# Patient Record
Sex: Female | Born: 1940 | Race: White | Hispanic: No | Smoking: Former smoker
Health system: Southern US, Community
[De-identification: ages and names within clinical notes are randomized; demographics above are authoritative.]

## PROBLEM LIST (undated history)

## (undated) DIAGNOSIS — G8929 Other chronic pain: Secondary | ICD-10-CM

## (undated) DIAGNOSIS — I272 Pulmonary hypertension, unspecified: Secondary | ICD-10-CM

## (undated) DIAGNOSIS — Z87440 Personal history of urinary (tract) infections: Secondary | ICD-10-CM

## (undated) DIAGNOSIS — Z8739 Personal history of other diseases of the musculoskeletal system and connective tissue: Secondary | ICD-10-CM

## (undated) DIAGNOSIS — R011 Cardiac murmur, unspecified: Secondary | ICD-10-CM

## (undated) DIAGNOSIS — Z87442 Personal history of urinary calculi: Secondary | ICD-10-CM

## (undated) DIAGNOSIS — G2581 Restless legs syndrome: Secondary | ICD-10-CM

## (undated) DIAGNOSIS — R112 Nausea with vomiting, unspecified: Secondary | ICD-10-CM

## (undated) DIAGNOSIS — D649 Anemia, unspecified: Secondary | ICD-10-CM

## (undated) DIAGNOSIS — H269 Unspecified cataract: Secondary | ICD-10-CM

## (undated) DIAGNOSIS — E785 Hyperlipidemia, unspecified: Secondary | ICD-10-CM

## (undated) DIAGNOSIS — K5790 Diverticulosis of intestine, part unspecified, without perforation or abscess without bleeding: Secondary | ICD-10-CM

## (undated) DIAGNOSIS — G4730 Sleep apnea, unspecified: Secondary | ICD-10-CM

## (undated) DIAGNOSIS — M549 Dorsalgia, unspecified: Secondary | ICD-10-CM

## (undated) DIAGNOSIS — Z9889 Other specified postprocedural states: Secondary | ICD-10-CM

## (undated) DIAGNOSIS — C801 Malignant (primary) neoplasm, unspecified: Secondary | ICD-10-CM

## (undated) DIAGNOSIS — I10 Essential (primary) hypertension: Secondary | ICD-10-CM

## (undated) DIAGNOSIS — E119 Type 2 diabetes mellitus without complications: Secondary | ICD-10-CM

## (undated) DIAGNOSIS — Z9289 Personal history of other medical treatment: Secondary | ICD-10-CM

## (undated) DIAGNOSIS — G629 Polyneuropathy, unspecified: Secondary | ICD-10-CM

## (undated) DIAGNOSIS — R42 Dizziness and giddiness: Secondary | ICD-10-CM

## (undated) DIAGNOSIS — I38 Endocarditis, valve unspecified: Secondary | ICD-10-CM

## (undated) DIAGNOSIS — K219 Gastro-esophageal reflux disease without esophagitis: Secondary | ICD-10-CM

## (undated) DIAGNOSIS — R609 Edema, unspecified: Secondary | ICD-10-CM

## (undated) DIAGNOSIS — R600 Localized edema: Secondary | ICD-10-CM

## (undated) DIAGNOSIS — E039 Hypothyroidism, unspecified: Secondary | ICD-10-CM

## (undated) HISTORY — PX: COLONOSCOPY: SHX174

## (undated) HISTORY — PX: APPENDECTOMY: SHX54

---

## 1979-09-29 HISTORY — PX: CHOLECYSTECTOMY: SHX55

## 1988-09-28 HISTORY — PX: DILATION AND CURETTAGE OF UTERUS: SHX78

## 1996-09-28 HISTORY — PX: CARPAL TUNNEL RELEASE: SHX101

## 1997-11-15 ENCOUNTER — Other Ambulatory Visit: Admission: RE | Admit: 1997-11-15 | Discharge: 1997-11-15 | Payer: Self-pay | Admitting: *Deleted

## 1998-05-09 ENCOUNTER — Other Ambulatory Visit: Admission: RE | Admit: 1998-05-09 | Discharge: 1998-05-09 | Payer: Self-pay | Admitting: *Deleted

## 1998-09-28 HISTORY — PX: TRIGEMINAL NERVE DECOMPRESSION: SHX2579

## 1999-06-12 ENCOUNTER — Other Ambulatory Visit: Admission: RE | Admit: 1999-06-12 | Discharge: 1999-06-12 | Payer: Self-pay | Admitting: *Deleted

## 2000-08-11 ENCOUNTER — Other Ambulatory Visit: Admission: RE | Admit: 2000-08-11 | Discharge: 2000-08-11 | Payer: Self-pay | Admitting: *Deleted

## 2001-09-19 ENCOUNTER — Other Ambulatory Visit: Admission: RE | Admit: 2001-09-19 | Discharge: 2001-09-19 | Payer: Self-pay | Admitting: *Deleted

## 2002-09-18 ENCOUNTER — Other Ambulatory Visit: Admission: RE | Admit: 2002-09-18 | Discharge: 2002-09-18 | Payer: Self-pay | Admitting: *Deleted

## 2011-09-29 HISTORY — PX: BACK SURGERY: SHX140

## 2012-01-20 ENCOUNTER — Other Ambulatory Visit: Payer: Self-pay | Admitting: Neurosurgery

## 2012-01-20 DIAGNOSIS — M545 Low back pain: Secondary | ICD-10-CM

## 2012-04-20 ENCOUNTER — Other Ambulatory Visit: Payer: Self-pay | Admitting: Neurosurgery

## 2012-05-09 ENCOUNTER — Encounter (HOSPITAL_COMMUNITY): Payer: Self-pay | Admitting: Pharmacy Technician

## 2012-05-20 ENCOUNTER — Encounter (HOSPITAL_COMMUNITY)
Admission: RE | Admit: 2012-05-20 | Discharge: 2012-05-20 | Disposition: A | Discharge status: Home / Self Care | Payer: Medicare Other | Source: Ambulatory Visit | Attending: Neurosurgery | Admitting: Neurosurgery

## 2012-05-20 ENCOUNTER — Encounter (HOSPITAL_COMMUNITY): Payer: Self-pay

## 2012-05-20 HISTORY — DX: Anemia, unspecified: D64.9

## 2012-05-20 HISTORY — DX: Gastro-esophageal reflux disease without esophagitis: K21.9

## 2012-05-20 HISTORY — DX: Malignant (primary) neoplasm, unspecified: C80.1

## 2012-05-20 HISTORY — DX: Cardiac murmur, unspecified: R01.1

## 2012-05-20 HISTORY — DX: Other specified postprocedural states: R11.2

## 2012-05-20 HISTORY — DX: Essential (primary) hypertension: I10

## 2012-05-20 HISTORY — DX: Sleep apnea, unspecified: G47.30

## 2012-05-20 HISTORY — DX: Other specified postprocedural states: Z98.890

## 2012-05-20 LAB — BASIC METABOLIC PANEL
GFR calc Af Amer: 43 mL/min — ABNORMAL LOW (ref >90)
GFR calc non Af Amer: 37 mL/min — ABNORMAL LOW (ref >90)
Potassium: 4.9 mEq/L (ref 3.5–5.1)
Sodium: 137 mEq/L (ref 135–145)

## 2012-05-20 LAB — CBC
Hemoglobin: 11.6 g/dL — ABNORMAL LOW (ref 12.0–15.0)
RBC: 4.37 MIL/uL (ref 3.87–5.11)

## 2012-05-20 LAB — SURGICAL PCR SCREEN: MRSA, PCR: NEGATIVE

## 2012-05-20 NOTE — Pre-Procedure Instructions (Addendum)
20 SHONA PARDO  05/20/2012   Your procedure is scheduled on:  05/25/12  Report to [facility] at 630 AM.  Call this number if you have problems the morning of surgery: [phone]   Remember:   Do not eat food or drink:After Midnight.    Take these medicines the morning of surgery with A SIP OF WATER: atenolol ,synthroid, ultram        STOP aspirin, omega 3 fish oil , multi vit , celebrex now   Do not wear jewelry, make-up or nail polish.  Do not wear lotions, powders, or perfumes. You may wear deodorant.  Do not shave 48 hours prior to surgery. Men may shave face and neck.  Do not bring valuables to the hospital.  Contacts, dentures or bridgework may not be worn into surgery.  Leave suitcase in the car. After surgery it may be brought to your room.  For patients admitted to the hospital, checkout time is 11:00 AM the day of discharge.   Patients discharged the day of surgery will not be allowed to drive home.  Name and phone number of your driver: Onalee Hua [phone] son  Special Instructions: CHG Shower Use Special Wash: 1/2 bottle night before surgery and 1/2 bottle morning of surgery.   Please read over the following fact sheets that you were given: Pain Booklet, Coughing and Deep Breathing, Blood Transfusion Information, MRSA Information and Surgical Site Infection Prevention

## 2012-05-20 NOTE — Progress Notes (Signed)
req'd echo, notes, ekg from dr Dulce Sellar at [facility] cardiology in [place], req'd sleep study from [facility] [phone]

## 2012-05-23 NOTE — Progress Notes (Signed)
Sleep study received and placed in chart.

## 2012-05-24 MED ORDER — CEFAZOLIN SODIUM-DEXTROSE 2-3 GM-% IV SOLR
2.0000 g | INTRAVENOUS | Status: AC
  Administered 2012-05-25 (×2): 2 g via INTRAVENOUS
  Filled 2012-05-24: qty 50

## 2012-05-24 NOTE — Progress Notes (Signed)
I spoke with Kennith Center at [facility] office in [place]  She will fax ov echo and ekg ... To [phone] now

## 2012-05-25 ENCOUNTER — Ambulatory Visit (HOSPITAL_COMMUNITY): Payer: Medicare Other

## 2012-05-25 ENCOUNTER — Encounter (HOSPITAL_COMMUNITY): Payer: Self-pay | Admitting: *Deleted

## 2012-05-25 ENCOUNTER — Encounter (HOSPITAL_COMMUNITY)
Admission: RE | Disposition: A | Discharge status: Skilled Nursing Facility | Payer: Self-pay | Source: Ambulatory Visit | Attending: Neurosurgery

## 2012-05-25 ENCOUNTER — Encounter (HOSPITAL_COMMUNITY): Payer: Self-pay | Admitting: Anesthesiology

## 2012-05-25 ENCOUNTER — Inpatient Hospital Stay (HOSPITAL_COMMUNITY)
Admission: RE | Admit: 2012-05-25 | Discharge: 2012-06-02 | DRG: 457 | Disposition: A | Discharge status: Skilled Nursing Facility | Payer: Medicare Other | Source: Ambulatory Visit | Attending: Neurosurgery | Admitting: Neurosurgery

## 2012-05-25 ENCOUNTER — Ambulatory Visit (HOSPITAL_COMMUNITY): Payer: Medicare Other | Admitting: Anesthesiology

## 2012-05-25 DIAGNOSIS — Z01818 Encounter for other preprocedural examination: Secondary | ICD-10-CM

## 2012-05-25 DIAGNOSIS — Z6841 Body Mass Index (BMI) 40.0 and over, adult: Secondary | ICD-10-CM

## 2012-05-25 DIAGNOSIS — Z794 Long term (current) use of insulin: Secondary | ICD-10-CM

## 2012-05-25 DIAGNOSIS — Z981 Arthrodesis status: Secondary | ICD-10-CM

## 2012-05-25 DIAGNOSIS — N183 Chronic kidney disease, stage 3 unspecified: Secondary | ICD-10-CM | POA: Diagnosis present

## 2012-05-25 DIAGNOSIS — E669 Obesity, unspecified: Secondary | ICD-10-CM | POA: Diagnosis present

## 2012-05-25 DIAGNOSIS — M51379 Other intervertebral disc degeneration, lumbosacral region without mention of lumbar back pain or lower extremity pain: Secondary | ICD-10-CM | POA: Diagnosis present

## 2012-05-25 DIAGNOSIS — E162 Hypoglycemia, unspecified: Secondary | ICD-10-CM | POA: Diagnosis not present

## 2012-05-25 DIAGNOSIS — Z7982 Long term (current) use of aspirin: Secondary | ICD-10-CM

## 2012-05-25 DIAGNOSIS — K219 Gastro-esophageal reflux disease without esophagitis: Secondary | ICD-10-CM | POA: Diagnosis present

## 2012-05-25 DIAGNOSIS — M4120 Other idiopathic scoliosis, site unspecified: Principal | ICD-10-CM | POA: Diagnosis present

## 2012-05-25 DIAGNOSIS — Z01812 Encounter for preprocedural laboratory examination: Secondary | ICD-10-CM

## 2012-05-25 DIAGNOSIS — E871 Hypo-osmolality and hyponatremia: Secondary | ICD-10-CM | POA: Diagnosis not present

## 2012-05-25 DIAGNOSIS — Z79899 Other long term (current) drug therapy: Secondary | ICD-10-CM

## 2012-05-25 DIAGNOSIS — I959 Hypotension, unspecified: Secondary | ICD-10-CM | POA: Diagnosis not present

## 2012-05-25 DIAGNOSIS — N179 Acute kidney failure, unspecified: Secondary | ICD-10-CM | POA: Diagnosis not present

## 2012-05-25 DIAGNOSIS — Z87891 Personal history of nicotine dependence: Secondary | ICD-10-CM

## 2012-05-25 DIAGNOSIS — D649 Anemia, unspecified: Secondary | ICD-10-CM | POA: Diagnosis not present

## 2012-05-25 DIAGNOSIS — Z85820 Personal history of malignant melanoma of skin: Secondary | ICD-10-CM

## 2012-05-25 DIAGNOSIS — G4730 Sleep apnea, unspecified: Secondary | ICD-10-CM | POA: Diagnosis present

## 2012-05-25 DIAGNOSIS — M47817 Spondylosis without myelopathy or radiculopathy, lumbosacral region: Secondary | ICD-10-CM | POA: Diagnosis present

## 2012-05-25 DIAGNOSIS — I129 Hypertensive chronic kidney disease with stage 1 through stage 4 chronic kidney disease, or unspecified chronic kidney disease: Secondary | ICD-10-CM | POA: Diagnosis present

## 2012-05-25 DIAGNOSIS — M5137 Other intervertebral disc degeneration, lumbosacral region: Secondary | ICD-10-CM | POA: Diagnosis present

## 2012-05-25 LAB — GLUCOSE, CAPILLARY

## 2012-05-25 SURGERY — POSTERIOR LUMBAR FUSION 4 LEVEL
Anesthesia: General | Site: Back | Laterality: Bilateral | Wound class: Clean

## 2012-05-25 MED ORDER — VECURONIUM BROMIDE 10 MG IV SOLR
INTRAVENOUS | Status: DC | PRN
  Administered 2012-05-25: 7 mg via INTRAVENOUS
  Administered 2012-05-25: 1 mg via INTRAVENOUS
  Administered 2012-05-25 (×4): 2 mg via INTRAVENOUS
  Administered 2012-05-25: 1 mg via INTRAVENOUS

## 2012-05-25 MED ORDER — LACTATED RINGERS IV SOLN
INTRAVENOUS | Status: DC | PRN
  Administered 2012-05-25 (×3): via INTRAVENOUS

## 2012-05-25 MED ORDER — ACETAMINOPHEN 325 MG PO TABS: 650.0000 mg | ORAL_TABLET | ORAL | Status: DC | PRN

## 2012-05-25 MED ORDER — HYDROMORPHONE HCL PF 1 MG/ML IJ SOLN
0.5000 mg | INTRAMUSCULAR | Status: DC | PRN
  Administered 2012-05-25 (×3): 0.5 mg via INTRAVENOUS
  Administered 2012-05-26: 1 mg via INTRAVENOUS
  Administered 2012-05-26 (×2): 0.5 mg via INTRAVENOUS
  Administered 2012-05-27 (×2): 1 mg via INTRAVENOUS
  Administered 2012-05-27 – 2012-05-28 (×3): 0.5 mg via INTRAVENOUS
  Administered 2012-05-28 (×3): 1 mg via INTRAVENOUS
  Administered 2012-05-28 (×2): 0.5 mg via INTRAVENOUS
  Administered 2012-05-29 – 2012-06-02 (×8): 1 mg via INTRAVENOUS
  Filled 2012-05-25 (×25): qty 1

## 2012-05-25 MED ORDER — PIOGLITAZONE HCL 45 MG PO TABS
45.0000 mg | ORAL_TABLET | Freq: Every day | ORAL | Status: DC
  Administered 2012-05-25 – 2012-06-02 (×4): 45 mg via ORAL
  Filled 2012-05-25 (×9): qty 1

## 2012-05-25 MED ORDER — OXYCODONE-ACETAMINOPHEN 5-325 MG PO TABS
1.0000 | ORAL_TABLET | ORAL | Status: DC | PRN
  Administered 2012-05-26: 2 via ORAL
  Administered 2012-05-27 (×2): 1 via ORAL
  Administered 2012-05-29 – 2012-06-02 (×17): 2 via ORAL
  Filled 2012-05-25 (×3): qty 2
  Filled 2012-05-25: qty 1
  Filled 2012-05-25 (×3): qty 2
  Filled 2012-05-25: qty 1
  Filled 2012-05-25 (×13): qty 2

## 2012-05-25 MED ORDER — BACITRACIN 50000 UNITS IM SOLR
INTRAMUSCULAR | Status: AC
  Filled 2012-05-25: qty 1

## 2012-05-25 MED ORDER — GLIMEPIRIDE 4 MG PO TABS
4.0000 mg | ORAL_TABLET | Freq: Two times a day (BID) | ORAL | Status: DC
  Filled 2012-05-25: qty 1

## 2012-05-25 MED ORDER — CEFAZOLIN SODIUM 1-5 GM-% IV SOLN
1.0000 g | Freq: Three times a day (TID) | INTRAVENOUS | Status: AC
  Administered 2012-05-25 – 2012-05-30 (×15): 1 g via INTRAVENOUS
  Filled 2012-05-25 (×21): qty 50

## 2012-05-25 MED ORDER — TRAMADOL HCL 50 MG PO TABS
50.0000 mg | ORAL_TABLET | Freq: Four times a day (QID) | ORAL | Status: DC
  Administered 2012-05-25 – 2012-05-27 (×7): 50 mg via ORAL
  Filled 2012-05-25 (×11): qty 1

## 2012-05-25 MED ORDER — ONDANSETRON HCL 4 MG/2ML IJ SOLN
INTRAMUSCULAR | Status: DC | PRN
  Administered 2012-05-25: 4 mg via INTRAVENOUS

## 2012-05-25 MED ORDER — METFORMIN HCL 500 MG PO TABS: 1000.0000 mg | ORAL_TABLET | Freq: Two times a day (BID) | ORAL | Status: DC

## 2012-05-25 MED ORDER — TRIAMTERENE-HCTZ 75-50 MG PO TABS
1.0000 | ORAL_TABLET | Freq: Every day | ORAL | Status: DC
  Administered 2012-05-25 – 2012-05-26 (×2): 1 via ORAL
  Filled 2012-05-25 (×2): qty 1

## 2012-05-25 MED ORDER — THROMBIN 20000 UNITS EX SOLR
CUTANEOUS | Status: DC | PRN
  Administered 2012-05-25 (×2): via TOPICAL

## 2012-05-25 MED ORDER — METFORMIN HCL 500 MG PO TABS
1000.0000 mg | ORAL_TABLET | Freq: Two times a day (BID) | ORAL | Status: DC
  Administered 2012-05-25 – 2012-05-26 (×2): 1000 mg via ORAL
  Filled 2012-05-25 (×5): qty 2

## 2012-05-25 MED ORDER — ONDANSETRON HCL 4 MG/2ML IJ SOLN: 4.0000 mg | Freq: Once | INTRAMUSCULAR | Status: DC | PRN

## 2012-05-25 MED ORDER — SODIUM CHLORIDE 0.9 % IV SOLN: 250.0000 mL | INTRAVENOUS | Status: DC

## 2012-05-25 MED ORDER — DOCUSATE SODIUM 100 MG PO CAPS
100.0000 mg | ORAL_CAPSULE | Freq: Two times a day (BID) | ORAL | Status: DC
  Administered 2012-05-25 – 2012-06-02 (×16): 100 mg via ORAL
  Filled 2012-05-25 (×17): qty 1

## 2012-05-25 MED ORDER — ALLOPURINOL 300 MG PO TABS
300.0000 mg | ORAL_TABLET | Freq: Every day | ORAL | Status: DC
  Administered 2012-05-25 – 2012-06-02 (×9): 300 mg via ORAL
  Filled 2012-05-25 (×9): qty 1

## 2012-05-25 MED ORDER — CEFAZOLIN SODIUM-DEXTROSE 2-3 GM-% IV SOLR
INTRAVENOUS | Status: AC
  Filled 2012-05-25: qty 50

## 2012-05-25 MED ORDER — ROPINIROLE HCL 1 MG PO TABS
1.0000 mg | ORAL_TABLET | Freq: Every day | ORAL | Status: DC
  Administered 2012-05-25 – 2012-06-01 (×8): 1 mg via ORAL
  Filled 2012-05-25 (×10): qty 1

## 2012-05-25 MED ORDER — SODIUM CHLORIDE 0.9 % IJ SOLN: 3.0000 mL | INTRAMUSCULAR | Status: DC | PRN

## 2012-05-25 MED ORDER — HYDROMORPHONE HCL PF 1 MG/ML IJ SOLN
INTRAMUSCULAR | Status: AC
  Filled 2012-05-25: qty 1

## 2012-05-25 MED ORDER — ONDANSETRON HCL 4 MG/2ML IJ SOLN
4.0000 mg | INTRAMUSCULAR | Status: DC | PRN
  Filled 2012-05-25: qty 2

## 2012-05-25 MED ORDER — ATENOLOL 50 MG PO TABS
50.0000 mg | ORAL_TABLET | Freq: Every day | ORAL | Status: DC
  Filled 2012-05-25 (×2): qty 1

## 2012-05-25 MED ORDER — INSULIN GLARGINE 100 UNIT/ML ~~LOC~~ SOLN
54.0000 [IU] | Freq: Every day | SUBCUTANEOUS | Status: DC
  Administered 2012-05-25 – 2012-05-27 (×3): 54 [IU] via SUBCUTANEOUS

## 2012-05-25 MED ORDER — LIDOCAINE-EPINEPHRINE 1 %-1:100000 IJ SOLN
INTRAMUSCULAR | Status: DC | PRN
  Administered 2012-05-25: 10 mL

## 2012-05-25 MED ORDER — PHENOL 1.4 % MT LIQD: 1.0000 | OROMUCOSAL | Status: DC | PRN

## 2012-05-25 MED ORDER — ASPIRIN EC 81 MG PO TBEC
81.0000 mg | DELAYED_RELEASE_TABLET | Freq: Every day | ORAL | Status: DC
  Administered 2012-05-25 – 2012-06-02 (×8): 81 mg via ORAL
  Filled 2012-05-25 (×9): qty 1

## 2012-05-25 MED ORDER — LEVOTHYROXINE SODIUM 125 MCG PO TABS
125.0000 ug | ORAL_TABLET | Freq: Every day | ORAL | Status: DC
  Administered 2012-05-26 – 2012-06-02 (×8): 125 ug via ORAL
  Filled 2012-05-25 (×12): qty 1

## 2012-05-25 MED ORDER — MIDAZOLAM HCL 5 MG/5ML IJ SOLN
INTRAMUSCULAR | Status: DC | PRN
  Administered 2012-05-25: 1 mg via INTRAVENOUS

## 2012-05-25 MED ORDER — MENTHOL 3 MG MT LOZG: 1.0000 | LOZENGE | OROMUCOSAL | Status: DC | PRN

## 2012-05-25 MED ORDER — SODIUM CHLORIDE 0.9 % IR SOLN
Status: DC | PRN
  Administered 2012-05-25: 09:00:00

## 2012-05-25 MED ORDER — GLYCOPYRROLATE 0.2 MG/ML IJ SOLN
INTRAMUSCULAR | Status: DC | PRN
  Administered 2012-05-25: 0.1 mg via INTRAVENOUS
  Administered 2012-05-25: .9 mg via INTRAVENOUS

## 2012-05-25 MED ORDER — ACETAMINOPHEN 650 MG RE SUPP: 650.0000 mg | RECTAL | Status: DC | PRN

## 2012-05-25 MED ORDER — PROPOFOL 10 MG/ML IV EMUL
INTRAVENOUS | Status: DC | PRN
  Administered 2012-05-25: 150 mg via INTRAVENOUS

## 2012-05-25 MED ORDER — VITAMIN B-12 1000 MCG PO TABS
1000.0000 ug | ORAL_TABLET | Freq: Every day | ORAL | Status: DC
  Administered 2012-05-25 – 2012-06-02 (×9): 1000 ug via ORAL
  Filled 2012-05-25 (×9): qty 1

## 2012-05-25 MED ORDER — HYDROMORPHONE HCL PF 1 MG/ML IJ SOLN
0.2500 mg | INTRAMUSCULAR | Status: DC | PRN
  Administered 2012-05-25 (×3): 0.5 mg via INTRAVENOUS

## 2012-05-25 MED ORDER — SODIUM CHLORIDE 0.9 % IV SOLN
INTRAVENOUS | Status: AC
  Filled 2012-05-25: qty 500

## 2012-05-25 MED ORDER — CYCLOBENZAPRINE HCL 10 MG PO TABS
10.0000 mg | ORAL_TABLET | Freq: Three times a day (TID) | ORAL | Status: DC | PRN
  Administered 2012-06-01 (×2): 10 mg via ORAL
  Filled 2012-05-25 (×3): qty 1

## 2012-05-25 MED ORDER — ACETAMINOPHEN 10 MG/ML IV SOLN
INTRAVENOUS | Status: DC | PRN
  Administered 2012-05-25: 1000 mg via INTRAVENOUS

## 2012-05-25 MED ORDER — BUPIVACAINE HCL (PF) 0.25 % IJ SOLN
INTRAMUSCULAR | Status: DC | PRN
  Administered 2012-05-25: 20 mL

## 2012-05-25 MED ORDER — POTASSIUM CHLORIDE CRYS ER 20 MEQ PO TBCR
20.0000 meq | EXTENDED_RELEASE_TABLET | Freq: Every day | ORAL | Status: DC
  Administered 2012-05-26: 20 meq via ORAL
  Filled 2012-05-25: qty 1

## 2012-05-25 MED ORDER — PANTOPRAZOLE SODIUM 40 MG IV SOLR
40.0000 mg | Freq: Every day | INTRAVENOUS | Status: DC
  Administered 2012-05-25: 40 mg via INTRAVENOUS
  Filled 2012-05-25 (×2): qty 40

## 2012-05-25 MED ORDER — 0.9 % SODIUM CHLORIDE (POUR BTL) OPTIME
TOPICAL | Status: DC | PRN
  Administered 2012-05-25: 1000 mL

## 2012-05-25 MED ORDER — LIDOCAINE HCL (CARDIAC) 20 MG/ML IV SOLN
INTRAVENOUS | Status: DC | PRN
  Administered 2012-05-25: 100 mg via INTRAVENOUS

## 2012-05-25 MED ORDER — ACETAMINOPHEN 10 MG/ML IV SOLN
INTRAVENOUS | Status: AC
  Filled 2012-05-25: qty 100

## 2012-05-25 MED ORDER — POTASSIUM CHLORIDE IN NACL 20-0.45 MEQ/L-% IV SOLN
INTRAVENOUS | Status: DC
  Administered 2012-05-25 – 2012-05-26 (×2): via INTRAVENOUS
  Filled 2012-05-25 (×3): qty 1000

## 2012-05-25 MED ORDER — DROPERIDOL 2.5 MG/ML IJ SOLN
INTRAMUSCULAR | Status: DC | PRN
  Administered 2012-05-25: 0.625 mg via INTRAVENOUS

## 2012-05-25 MED ORDER — SODIUM CHLORIDE 0.9 % IJ SOLN
3.0000 mL | Freq: Two times a day (BID) | INTRAMUSCULAR | Status: DC
  Administered 2012-05-25 – 2012-06-01 (×12): 3 mL via INTRAVENOUS

## 2012-05-25 MED ORDER — CELECOXIB 200 MG PO CAPS
200.0000 mg | ORAL_CAPSULE | Freq: Two times a day (BID) | ORAL | Status: DC
  Administered 2012-05-25 – 2012-05-26 (×2): 200 mg via ORAL
  Filled 2012-05-25 (×3): qty 1

## 2012-05-25 MED ORDER — LOSARTAN POTASSIUM 50 MG PO TABS
100.0000 mg | ORAL_TABLET | Freq: Every day | ORAL | Status: DC
  Administered 2012-05-25 – 2012-05-26 (×2): 100 mg via ORAL
  Filled 2012-05-25 (×2): qty 2

## 2012-05-25 MED ORDER — PANTOPRAZOLE SODIUM 40 MG PO TBEC
40.0000 mg | DELAYED_RELEASE_TABLET | Freq: Every day | ORAL | Status: DC
  Administered 2012-05-25 – 2012-06-02 (×9): 40 mg via ORAL
  Filled 2012-05-25 (×9): qty 1

## 2012-05-25 MED ORDER — ATORVASTATIN CALCIUM 40 MG PO TABS
40.0000 mg | ORAL_TABLET | Freq: Every day | ORAL | Status: DC
  Administered 2012-05-25 – 2012-06-01 (×8): 40 mg via ORAL
  Filled 2012-05-25 (×9): qty 1

## 2012-05-25 MED ORDER — FENTANYL CITRATE 0.05 MG/ML IJ SOLN
INTRAMUSCULAR | Status: DC | PRN
  Administered 2012-05-25: 75 ug via INTRAVENOUS
  Administered 2012-05-25 (×4): 50 ug via INTRAVENOUS
  Administered 2012-05-25: 75 ug via INTRAVENOUS
  Administered 2012-05-25: 50 ug via INTRAVENOUS

## 2012-05-25 MED ORDER — NEOSTIGMINE METHYLSULFATE 1 MG/ML IJ SOLN
INTRAMUSCULAR | Status: DC | PRN
  Administered 2012-05-25: 5 mg via INTRAVENOUS

## 2012-05-25 MED ORDER — GLIMEPIRIDE 4 MG PO TABS
4.0000 mg | ORAL_TABLET | Freq: Two times a day (BID) | ORAL | Status: DC
  Administered 2012-05-25 – 2012-05-28 (×5): 4 mg via ORAL
  Filled 2012-05-25 (×10): qty 1

## 2012-05-25 MED ORDER — ADULT MULTIVITAMIN W/MINERALS CH
1.0000 | ORAL_TABLET | Freq: Every day | ORAL | Status: DC
  Administered 2012-05-25 – 2012-06-02 (×9): 1 via ORAL
  Filled 2012-05-25 (×9): qty 1

## 2012-05-25 SURGICAL SUPPLY — 79 items
5.5 mm curved Ti Rod 125 mm ×2 IMPLANT
5.5 mm straight Ti Rod 125 mm ×2 IMPLANT
BAG DECANTER FOR FLEXI CONT (MISCELLANEOUS) ×2 IMPLANT
BENZOIN TINCTURE PRP APPL 2/3 (GAUZE/BANDAGES/DRESSINGS) ×2 IMPLANT
BLADE SURG 11 STRL SS (BLADE) ×2 IMPLANT
BLADE SURG ROTATE 9660 (MISCELLANEOUS) IMPLANT
BRUSH SCRUB EZ PLAIN DRY (MISCELLANEOUS) ×2 IMPLANT
BUR MATCHSTICK NEURO 3.0 LAGG (BURR) ×2 IMPLANT
BUR PRECISION FLUTE 6.0 (BURR) ×2 IMPLANT
CALIBER 7-11 10X22 (Neuro Prosthesis/Implant) ×8 IMPLANT
CANISTER SUCTION 2500CC (MISCELLANEOUS) ×2 IMPLANT
CAP LOCKING REVERE (Cap) ×20 IMPLANT
CLOTH BEACON ORANGE TIMEOUT ST (SAFETY) ×2 IMPLANT
CONNECTOR VAR CROSS 5.5X38-50 (Connector) ×2 IMPLANT
CONT SPEC 4OZ CLIKSEAL STRL BL (MISCELLANEOUS) ×4 IMPLANT
COVER BACK TABLE 24X17X13 BIG (DRAPES) ×2 IMPLANT
COVER TABLE BACK 60X90 (DRAPES) IMPLANT
DECANTER SPIKE VIAL GLASS SM (MISCELLANEOUS) ×2 IMPLANT
DERMABOND ADVANCED (GAUZE/BANDAGES/DRESSINGS) ×1
DERMABOND ADVANCED .7 DNX12 (GAUZE/BANDAGES/DRESSINGS) ×1 IMPLANT
DRAPE C-ARM 42X72 X-RAY (DRAPES) ×4 IMPLANT
DRAPE C-ARMOR (DRAPES) ×2 IMPLANT
DRAPE LAPAROTOMY 100X72X124 (DRAPES) ×2 IMPLANT
DRAPE POUCH INSTRU U-SHP 10X18 (DRAPES) ×2 IMPLANT
DRAPE PROXIMA HALF (DRAPES) IMPLANT
DRAPE SURG 17X23 STRL (DRAPES) ×2 IMPLANT
DRSG OPSITE 4X5.5 SM (GAUZE/BANDAGES/DRESSINGS) ×6 IMPLANT
ELECT BLADE 4.0 EZ CLEAN MEGAD (MISCELLANEOUS) ×2
ELECT REM PT RETURN 9FT ADLT (ELECTROSURGICAL) ×2
ELECTRODE BLDE 4.0 EZ CLN MEGD (MISCELLANEOUS) ×1 IMPLANT
ELECTRODE REM PT RTRN 9FT ADLT (ELECTROSURGICAL) ×1 IMPLANT
EVACUATOR 3/16  PVC DRAIN (DRAIN) ×2
EVACUATOR 3/16 PVC DRAIN (DRAIN) ×2 IMPLANT
GAUZE SPONGE 4X4 16PLY XRAY LF (GAUZE/BANDAGES/DRESSINGS) ×4 IMPLANT
GLOVE BIO SURGEON STRL SZ8 (GLOVE) ×4 IMPLANT
GLOVE BIOGEL PI IND STRL 7.0 (GLOVE) ×4 IMPLANT
GLOVE BIOGEL PI IND STRL 7.5 (GLOVE) ×1 IMPLANT
GLOVE BIOGEL PI INDICATOR 7.0 (GLOVE) ×4
GLOVE BIOGEL PI INDICATOR 7.5 (GLOVE) ×1
GLOVE ECLIPSE 6.5 STRL STRAW (GLOVE) ×2 IMPLANT
GLOVE ECLIPSE 7.5 STRL STRAW (GLOVE) ×2 IMPLANT
GLOVE EXAM NITRILE LRG STRL (GLOVE) IMPLANT
GLOVE EXAM NITRILE MD LF STRL (GLOVE) ×4 IMPLANT
GLOVE EXAM NITRILE XL STR (GLOVE) IMPLANT
GLOVE EXAM NITRILE XS STR PU (GLOVE) IMPLANT
GLOVE INDICATOR 8.5 STRL (GLOVE) ×4 IMPLANT
GLOVE SS BIOGEL STRL SZ 6.5 (GLOVE) ×5 IMPLANT
GLOVE SUPERSENSE BIOGEL SZ 6.5 (GLOVE) ×5
GOWN BRE IMP SLV AUR LG STRL (GOWN DISPOSABLE) ×4 IMPLANT
GOWN BRE IMP SLV AUR XL STRL (GOWN DISPOSABLE) ×6 IMPLANT
GOWN STRL REIN 2XL LVL4 (GOWN DISPOSABLE) IMPLANT
KIT BASIN OR (CUSTOM PROCEDURE TRAY) ×2 IMPLANT
KIT INFUSE SMALL (Orthopedic Implant) ×2 IMPLANT
KIT ROOM TURNOVER OR (KITS) ×2 IMPLANT
MARKER SKIN DUAL TIP RULER LAB (MISCELLANEOUS) ×2 IMPLANT
MILL MEDIUM DISP (BLADE) ×2 IMPLANT
MIX DBX 10CC 35% BONE (Bone Implant) ×2 IMPLANT
NEEDLE HYPO 25X1 1.5 SAFETY (NEEDLE) ×2 IMPLANT
NS IRRIG 1000ML POUR BTL (IV SOLUTION) ×2 IMPLANT
PACK LAMINECTOMY NEURO (CUSTOM PROCEDURE TRAY) ×2 IMPLANT
PAD ARMBOARD 7.5X6 YLW CONV (MISCELLANEOUS) ×6 IMPLANT
PATTIES SURGICAL 1X1 (DISPOSABLE) ×4 IMPLANT
SCREW PEDICLE 6.5MMX35MM (Screw) ×12 IMPLANT
SCREW PEDICLE 6.5X40MM (Screw) ×8 IMPLANT
SPACER CALIBER 10X22MM 8-12MM (Spacer) ×4 IMPLANT
SPACER CALIBER 10X22MM 9-13MM (Spacer) ×4 IMPLANT
SPONGE GAUZE 4X4 12PLY (GAUZE/BANDAGES/DRESSINGS) ×2 IMPLANT
SPONGE LAP 4X18 X RAY DECT (DISPOSABLE) IMPLANT
SPONGE SURGIFOAM ABS GEL 100 (HEMOSTASIS) ×4 IMPLANT
STRIP CLOSURE SKIN 1/2X4 (GAUZE/BANDAGES/DRESSINGS) ×2 IMPLANT
SUT VIC AB 0 CT1 18XCR BRD8 (SUTURE) ×2 IMPLANT
SUT VIC AB 0 CT1 8-18 (SUTURE) ×2
SUT VIC AB 2-0 CT1 18 (SUTURE) ×4 IMPLANT
SUT VICRYL 4-0 PS2 18IN ABS (SUTURE) ×2 IMPLANT
SYR 20ML ECCENTRIC (SYRINGE) ×2 IMPLANT
TOWEL OR 17X24 6PK STRL BLUE (TOWEL DISPOSABLE) ×2 IMPLANT
TOWEL OR 17X26 10 PK STRL BLUE (TOWEL DISPOSABLE) ×2 IMPLANT
TRAY FOLEY CATH 14FRSI W/METER (CATHETERS) ×2 IMPLANT
WATER STERILE IRR 1000ML POUR (IV SOLUTION) ×2 IMPLANT

## 2012-05-25 NOTE — Anesthesia Preprocedure Evaluation (Addendum)
Anesthesia Evaluation  Patient identified by MRN, date of birth, ID band Patient awake    Reviewed: Allergy & Precautions, H&P , NPO status , Patient's Chart, lab work & pertinent test results, reviewed documented beta blocker date and time   History of Anesthesia Complications (+) PONV  Airway Mallampati: II TM Distance: >3 FB Neck ROM: full    Dental  (+) Dental Advidsory Given and Teeth Intact   Pulmonary shortness of breath and with exertion, sleep apnea and Continuous Positive Airway Pressure Ventilation ,  breath sounds clear to auscultation        Cardiovascular hypertension, Pt. on home beta blockers + Valvular Problems/Murmurs AS and MR Rhythm:Regular Rate:Normal     Neuro/Psych    GI/Hepatic GERD-  Medicated and Controlled,  Endo/Other    Renal/GU      Musculoskeletal   Abdominal   Peds  Hematology   Anesthesia Other Findings   Reproductive/Obstetrics                         Anesthesia Physical Anesthesia Plan  ASA: IV  Anesthesia Plan: General   Post-op Pain Management:    Induction: Intravenous  Airway Management Planned: Oral ETT  Additional Equipment:   Intra-op Plan:   Post-operative Plan: Extubation in OR  Informed Consent: I have reviewed the patients History and Physical, chart, labs and discussed the procedure including the risks, benefits and alternatives for the proposed anesthesia with the patient or authorized representative who has indicated his/her understanding and acceptance.   Dental Advisory Given  Plan Discussed with: Anesthesiologist, CRNA and Surgeon  Anesthesia Plan Comments:        Anesthesia Quick Evaluation

## 2012-05-25 NOTE — Preoperative (Signed)
Beta Blockers   Reason not to administer Beta Blockers:Not Applicable 

## 2012-05-25 NOTE — Anesthesia Procedure Notes (Signed)
Procedure Name: Intubation Date/Time: 05/25/2012 8:47 AM Performed by: Carmela Rima Pre-anesthesia Checklist: Emergency Drugs available, Patient identified, Timeout performed, Suction available and Patient being monitored Patient Re-evaluated:Patient Re-evaluated prior to inductionOxygen Delivery Method: Circle system utilized Preoxygenation: Pre-oxygenation with 100% oxygen Intubation Type: IV induction Ventilation: Mask ventilation without difficulty Laryngoscope Size: Mac and 3 Grade View: Grade I Tube type: Oral Tube size: 7.0 mm Number of attempts: 1 Placement Confirmation: ETT inserted through vocal cords under direct vision,  breath sounds checked- equal and bilateral and positive ETCO2 Secured at: 22 cm Tube secured with: Tape Dental Injury: Teeth and Oropharynx as per pre-operative assessment

## 2012-05-25 NOTE — H&P (Signed)
Paige Holland is an 71 y.o. female.   Chief Complaint: back and right greater left leg pain HPI: Patient very pleasant 70 one of them is a long-standing back and bilateral leg pain with neurogenic claudication or very short distances. Has been refractory to all forms of conservative treatment with integumentary physical therapy injections and time she's had developed weakness in the right foot imaging revealed severe lumbar spinal stenosis and spinal listhesis as well as degenerative scoliosis extending from L2 down to S1 due to her failure conservative treatment imaging findings and progression of clinical syndrome she was recommended decompression it was a short procedure from L2 down to S1 and excess will the risks benefits of the operation as well as perioperative course and expectations of outcome and alternatives of surgery Paige Holland stands and agrees to proceed forward.  Past Medical History  Diagnosis Date  . PONV (postoperative nausea and vomiting)   . Hypertension   . Shortness of breath   . Sleep apnea     does not use cpap but has one  . Heart murmur   . GERD (gastroesophageal reflux disease)   . Cancer     melanoma lft arm  . Anemia     hx    Past Surgical History  Procedure Date  . Cholecystectomy 81  . Appendectomy   . Dilation and curettage of uterus 90  . Carpal tunnel release 98    rt  . Trigeminal nerve decompression 2000    gamma knife    History reviewed. No pertinent family history. Social History:  reports that she quit smoking about 46 years ago. Her smoking use included Cigarettes. She has a 1.5 pack-year smoking history. She does not have any smokeless tobacco history on file. She reports that she does not drink alcohol or use illicit drugs.  Allergies:  Allergies  Allergen Reactions  . Codeine Other (See Comments)    Stomach spasms  . Talwin (Pentazocine) Nausea And Vomiting  . Tape Other (See Comments)    "if adhesive left on for any length of time  area turns red, bumps"    Medications Prior to Admission  Medication Sig Dispense Refill  . allopurinol (ZYLOPRIM) 300 MG tablet Take 300 mg by mouth every morning.      Marland Kitchen aspirin EC 81 MG tablet Take 81 mg by mouth daily.      Marland Kitchen atenolol (TENORMIN) 50 MG tablet Take 50 mg by mouth every morning.      Marland Kitchen atorvastatin (LIPITOR) 40 MG tablet Take 40 mg by mouth at bedtime.      . Calcium Carbonate-Vit D-Min (CALCIUM 1200 PO) Take 1 tablet by mouth every morning.      . celecoxib (CELEBREX) 200 MG capsule Take 200 mg by mouth 2 (two) times daily.      . Cholecalciferol (VITAMIN D) 2000 UNITS tablet Take 2,000 Units by mouth every morning.      Marland Kitchen glimepiride (AMARYL) 4 MG tablet Take 4 mg by mouth 2 (two) times daily.      . insulin glargine (LANTUS) 100 UNIT/ML injection Inject 54 Units into the skin at bedtime.      Marland Kitchen levothyroxine (SYNTHROID, LEVOTHROID) 125 MCG tablet Take 125 mcg by mouth daily.      Marland Kitchen losartan (COZAAR) 100 MG tablet Take 100 mg by mouth every morning.      . metFORMIN (GLUCOPHAGE) 1000 MG tablet Take 1,000 mg by mouth 2 (two) times daily with a meal.      .  Multiple Vitamin (MULTIVITAMIN WITH MINERALS) TABS Take 1 tablet by mouth daily.      . Omega-3 Fatty Acids (FISH OIL) 1000 MG CAPS Take 1 capsule by mouth 3 (three) times daily.      Marland Kitchen omeprazole (PRILOSEC) 20 MG capsule Take 20 mg by mouth daily.      . pioglitazone (ACTOS) 45 MG tablet Take 45 mg by mouth every morning.      . potassium chloride SA (K-DUR,KLOR-CON) 20 MEQ tablet Take 20 mEq by mouth every morning.      Marland Kitchen rOPINIRole (REQUIP) 1 MG tablet Take 1 mg by mouth at bedtime.      . traMADol (ULTRAM) 50 MG tablet Take 50 mg by mouth every 6 (six) hours as needed. For pain      . triamterene-hydrochlorothiazide (MAXZIDE) 75-50 MG per tablet Take 1 tablet by mouth every morning.      . vitamin B-12 (CYANOCOBALAMIN) 1000 MCG tablet Take 1,000 mcg by mouth every morning.        Results for orders placed during  the hospital encounter of 05/25/12 (from the past 48 hour(s))  GLUCOSE, CAPILLARY     Status: Abnormal   Collection Time   05/25/12  6:50 AM      Component Value Range Comment   Glucose-Capillary 172 (*) 70 - 99 mg/dL    No results found.  Review of Systems  Constitutional: Negative.   HENT: Negative.   Eyes: Negative.   Respiratory: Negative.   Cardiovascular: Negative.   Gastrointestinal: Negative.   Genitourinary: Negative.   Musculoskeletal: Positive for myalgias, back pain and joint pain.  Skin: Negative.   Neurological: Positive for tingling and sensory change.  Endo/Heme/Allergies: Negative.   Psychiatric/Behavioral: Negative.     Blood pressure 148/81, pulse 47, temperature 98.2 F (36.8 C), temperature source Oral, resp. rate 18, SpO2 96.00%. Physical Exam  Constitutional: She is oriented to person, place, and time. She appears well-developed and well-nourished.  Eyes: Pupils are equal, round, and reactive to light.  Neck: Normal range of motion.  Respiratory: Effort normal and breath sounds normal.  GI: Soft.  Neurological: She is alert and oriented to person, place, and time. GCS eye subscore is 4. GCS verbal subscore is 5. GCS motor subscore is 6.  Reflex Scores:      Patellar reflexes are 0 on the right side and 0 on the left side.      Achilles reflexes are 0 on the right side and 0 on the left side.      Patient has 5 out of 5 strength in her iliopsoas, quads, hamstrings, on the left leg into tibialis gastrocs EHL are 5 out of 5 on the right leg she has a little weakness in dorsiflexion the right foot a 4 minus out of 5 in 4-5 weakness of her right EHL     Assessment/Plan 71 year old female presents for an L2 3, L3-4, L4-5, L5-S1 posterior lumbar interbody fusion  Khoen Genet P 05/25/2012, 8:13 AM

## 2012-05-25 NOTE — Anesthesia Postprocedure Evaluation (Signed)
  Anesthesia Post-op Note  Patient: Paige Holland  Procedure(s) Performed: Procedure(s) (LRB): POSTERIOR LUMBAR FUSION 4 LEVEL (Bilateral)  Patient Location: PACU  Anesthesia Type: General  Level of Consciousness: awake, alert  and oriented  Airway and Oxygen Therapy: Patient Spontanous Breathing and Patient connected to nasal cannula oxygen  Post-op Pain: moderate  Post-op Assessment: Post-op Vital signs reviewed  Post-op Vital Signs: Reviewed  Complications: No apparent anesthesia complications

## 2012-05-25 NOTE — Transfer of Care (Signed)
Immediate Anesthesia Transfer of Care Note  Patient: Paige Holland  Procedure(s) Performed: Procedure(s) (LRB): POSTERIOR LUMBAR FUSION 4 LEVEL (Bilateral)  Patient Location: PACU  Anesthesia Type: General  Level of Consciousness: awake, oriented and sedated  Airway & Oxygen Therapy: Patient Spontanous Breathing and Patient connected to face mask oxygen  Post-op Assessment: Report given to PACU RN, Post -op Vital signs reviewed and stable, Patient moving all extremities X 4 and Patient able to stick tongue midline  Post vital signs: Reviewed and stable  Complications: No apparent anesthesia complications

## 2012-05-25 NOTE — Op Note (Signed)
Preoperative diagnosis: Degenerative disc disease L2-3, L3-4, L4-5, L5-S1, degenerative scoliosis L2-3, L3-4, L4-5, and L5-S1. Lumbar spinal stenosis L2-3, L3-4, L4-5, and L5-S1. Severe foraminal stenosis of the L2, L3, L4, L5, and S1 nerve roots. A neurogenic claudication  Postoperative diagnosis: Same  Procedure: #1 decompressive lumbar laminectomies and excess will be needed with a standard interbody fusion at L2-3, L3-4, L4-5, and L5-S1.  2 posterior lumbar interbody fusion L2-3, L3-4, L4-5, L5-S1. Using the caliper expandable peek cages packed with local autograft mixed with DBX and BMP  #3 pedicle screw fixation L2-S1 using the globus Revere pedicle screw system 5.5  #4 posterior lateral arthrodesis L2-S1 using local autograft mixed DBX and BMP  #5 open reduction spinal deformity L2-S1  Placement of a large Hemovac drain  Surgeon: Jillyn Hidden Leslieann Whisman  Assistant: Coletta Memos  Anesthesia: Gen.  EBL: 600  History of present illness: Patient is a 71 year old female presents with severe back and bilateral leg pain and neurogenic claudication were very short distances. She is refractory to all forms of conservative treatment with anti-inflammatories physical therapy and steroids and time workup with MRI scan with and without contrast and CT scan as well as plain films showed severe degenerative disc disease and degenerative scoliosis lumbar spinal stenosis causing by foraminal stenosis of the L2, L3-L4, L5, L5-S1 nerve root the disc patient takes her treatment progression of clinical syndrome and imaging findings she was recommended decompression stabilization procedure at L2-3, L3-4, L4-5, L5-S1. Risks and benefits of the operation as well as perioperative course and expectations of outcome alternatives of surgery were all spine the patient she understood and agreed to proceed forward.  Operative procedure: Patient brought into the or was induced and general anesthesia and positioned prone the  [place] frame her back was prepped and draped in routine sterile fashion. A midline incision made the cartilage taken the subcutaneous tissue subperiosteal dissection carried lamina of L2, L3, L4, L5, S1 TPS been exposed from L2 down to S1 interoperative X. identify the appropriate level so that a decompression was begun by removing the spinous processes at L2, L3, L4, and L5. Then central laminotomy was begun there was marked ligamentous hypertrophy and hourglass compression of thecal sac the worst level was L4-5 but complete medial facetectomies were performed at L2-3 L3-4 L4-5 and L5-S1 11 aggressive and radical foraminotomies of the L2, L3, L4, L5, and S1 nerve roots bilaterally the aggressive abutting the superior tickling facet at each level also a gain access lateral margins disc space there was marked indentation thecal sac the worst level again was L4-5 in the worse side was the right side the after his complete decompression been performed attention to screw placement using a high-speed drill pilot holes were drilled and admitted for starting at L2 K. with the awl probed O55 Probed again and a 6 x 40 screw inserted at L2 on the right this was followed up in a similar fashion the screws at L3 03/03/1939 and 6 5 x 30 5J and L4, L5, and S1. This was repeated on the left side after all 10 screws were placed fluoroscopy confirmed good placement as well as the utilization of internal and external bony landmarks confirm no mediolateral breech roll used to confirm adequate positioning of the pedicle screws. Tensions and taken to the interbody work the first working on the left side distractors were inserted on the right at each disc space level sequentially the spaces were cleaned out with pituitary rongeurs Epstein curettes rotating cutters are adequate  endplate preparation been achieved a 9 mm cage was inserted L5-S1 then expanded approximately 3 turns up to approximately 11 mm it at L4-5-1 7 mm cage was placed  in a similar fashion as well as simple the cages at 3423. Aida Puffer is in place the left is to the right side and the fascia endplate preparation was carried out rotating cutters Epstein curettes and pituitary rongeurs after adequate endplate preparation been achieved the central interspaces at all 3 levels were packed with BMP local are graft mixed DBX the cages and similar size were all inserted on the right side and opened up and until 3 finger tension was achieved in this significantly reduce the scoliotic deformity. After all the implants and confirmed to be in good position posterior fluoroscopy confirmed all screws and implants it into position. Her wound scope was irrigated meticulous hemostasis was maintained aggressive decortication was care MTPs or lateral gutters all local autograft with active use and BMP was packed posterior laterally then to one 25 mm rods were contoured and inserted all nuts were tightened down the L5 screws compressed against S1 the L4 compressing against L5 the L3 compressed against L4 the L2 compressing is L3. Then a crossing was applied although foramina were reinspected prior to cross-link application to confirm patency Gelfoam was overlaid top of the dura across it was applied and the wounds closed in layers after a large Hemovac drain was placed and the wounds closed with after Vicryl the skin was) 4 subcuticular benzoin Steri-Strips applied patient recovered in stable condition at the M. and counts was correct.

## 2012-05-26 ENCOUNTER — Inpatient Hospital Stay (HOSPITAL_COMMUNITY): Payer: Medicare Other

## 2012-05-26 LAB — BASIC METABOLIC PANEL
CO2: 20 mEq/L (ref 19–32)
Calcium: 8 mg/dL — ABNORMAL LOW (ref 8.4–10.5)
Chloride: 100 mEq/L (ref 96–112)
Creatinine, Ser: 2.03 mg/dL — ABNORMAL HIGH (ref 0.50–1.10)
Creatinine, Ser: 2.27 mg/dL — ABNORMAL HIGH (ref 0.50–1.10)
GFR calc Af Amer: 27 mL/min — ABNORMAL LOW (ref >90)
Glucose, Bld: 234 mg/dL — ABNORMAL HIGH (ref 70–99)
Potassium: 4.7 mEq/L (ref 3.5–5.1)
Sodium: 132 mEq/L — ABNORMAL LOW (ref 135–145)

## 2012-05-26 LAB — CREATININE, URINE, RANDOM: Creatinine, Urine: 194.18 mg/dL

## 2012-05-26 LAB — CBC WITH DIFFERENTIAL/PLATELET
Basophils Absolute: 0 10*3/uL (ref 0.0–0.1)
HCT: 26.4 % — ABNORMAL LOW (ref 36.0–46.0)
Lymphocytes Relative: 19 % (ref 12–46)
Monocytes Absolute: 2.2 10*3/uL — ABNORMAL HIGH (ref 0.1–1.0)
Neutro Abs: 10.2 10*3/uL — ABNORMAL HIGH (ref 1.7–7.7)
Platelets: 215 10*3/uL (ref 150–400)
RDW: 17.1 % — ABNORMAL HIGH (ref 11.5–15.5)
WBC: 15.2 10*3/uL — ABNORMAL HIGH (ref 4.0–10.5)

## 2012-05-26 LAB — URINALYSIS, ROUTINE W REFLEX MICROSCOPIC
Nitrite: NEGATIVE
Specific Gravity, Urine: 1.024 (ref 1.005–1.030)
pH: 5 (ref 5.0–8.0)

## 2012-05-26 LAB — SODIUM, URINE, RANDOM: Sodium, Ur: <10 mEq/L

## 2012-05-26 LAB — URINE MICROSCOPIC-ADD ON

## 2012-05-26 MED ORDER — SODIUM CHLORIDE 0.9 % IV BOLUS (SEPSIS)
250.0000 mL | Freq: Once | INTRAVENOUS | Status: AC
  Administered 2012-05-26: 250 mL via INTRAVENOUS

## 2012-05-26 MED ORDER — SODIUM CHLORIDE 0.9 % IV SOLN
INTRAVENOUS | Status: DC
  Administered 2012-05-26 – 2012-05-27 (×3): via INTRAVENOUS
  Administered 2012-05-27: 500 mL via INTRAVENOUS
  Administered 2012-05-28: 04:00:00 via INTRAVENOUS

## 2012-05-26 MED ORDER — INSULIN ASPART 100 UNIT/ML ~~LOC~~ SOLN: 0.0000 [IU] | Freq: Every day | SUBCUTANEOUS | Status: DC

## 2012-05-26 MED ORDER — INSULIN ASPART 100 UNIT/ML ~~LOC~~ SOLN
0.0000 [IU] | Freq: Three times a day (TID) | SUBCUTANEOUS | Status: DC
  Administered 2012-05-27: 2 [IU] via SUBCUTANEOUS
  Administered 2012-05-28 (×2): 3 [IU] via SUBCUTANEOUS
  Administered 2012-05-29: 2 [IU] via SUBCUTANEOUS
  Administered 2012-05-30 (×2): 5 [IU] via SUBCUTANEOUS
  Administered 2012-05-31: 3 [IU] via SUBCUTANEOUS
  Administered 2012-05-31 – 2012-06-01 (×2): 2 [IU] via SUBCUTANEOUS
  Administered 2012-06-01: 5 [IU] via SUBCUTANEOUS
  Administered 2012-06-01: 3 [IU] via SUBCUTANEOUS
  Administered 2012-06-02: 2 [IU] via SUBCUTANEOUS
  Administered 2012-06-02: 3 [IU] via SUBCUTANEOUS

## 2012-05-26 MED ORDER — SODIUM CHLORIDE 0.9 % IV BOLUS (SEPSIS)
1000.0000 mL | Freq: Once | INTRAVENOUS | Status: AC
  Administered 2012-05-26: 1000 mL via INTRAVENOUS

## 2012-05-26 NOTE — Clinical Documentation Improvement (Signed)
DOCUMENTATION CLARIFICATION QUERY  THIS DOCUMENT IS NOT A PERMANENT PART OF THE MEDICAL RECORD          05/26/12  Dear Dr. Wynetta Emery Marton [place]  In an effort to better capture your patient's severity of illness, reflect appropriate length of stay and utilization of resources, a review of the patient medical record has revealed the following indicators.   Based on your clinical judgment, please clarify and document in a progress note and/or discharge summary the clinical condition associated with the following supporting information: In responding to this query please exercise your independent judgment.  The fact that a query is asked, does not imply that any particular answer is desired or expected.   Hello Dr. Wynetta Emery,  Please consider the below.  According to the documented Height and Weight in [facility]L/EPIC, the patients BMI is greater than 40. If your clinical findings/judgment agrees with this, if possible could you please help clarify the suspected diagnosis in the progress note and discharge summary. THANK YOU!    [facility]: A diagnosis of UNDERWEIGHT or MORBID OBESITY should have the BMI documented along with it.  Possible Clinical Conditions?  - Morbid Obesity  - Other condition (please document in the progress notes and/or discharge summary)  - Cannot Clinically determine at this time   Supporting Information:  Body mass index is 41.77 kg/(m^2). Filed Weights   05/25/12 1640  Weight: 228 lb 6.3 oz (103.6 kg)  Height: 5'2" 05/25/12     No additional documentation in chart upon review. SM    Thank You,  Saul Fordyce  Clinical Documentation Specialist: [phone] Pager  Health Information Management [place]

## 2012-05-26 NOTE — Consult Note (Addendum)
Reason for Consult: Acute Renal Failure on CKD3- oliguric Referring Physician: Dr. Donalee Citrin- Neurosurgery   HPI:  [name] past medical history significant for type 2 diabetes mellitus, hypertension and underlying chronic kidney disease stage III (baseline creatinine seemingly around 1.3-1.5). Underwent lumbar fusion surgery yesterday with intra-op records indicating some transient hypotension respond to lactated Ringer fluid boluses. Prior to surgery noted to be on Celebrex (chronic use), losartan and Maxzide. Denies preceding history of NSAID use, recent intravenous contrast exposure or recent antibiotic therapy. Denies any obstructive or irritative urinary symptoms including hematuria/foamy urine. Denies any emerging skin rash or pedal edema. Postoperatively, noted to have declining urine output and bladder scans revealed no urinary retention. Today on labs noted to have elevated creatinine to 2.0 from 1.35 yesterday. Also noted to have a significant hemoglobin drop from 11.5 yesterday to 8.5 today in spite of a positive net fluid balance of 1.5 L.  When seen today, the patient denies any shortness of breath on laying flat and her predominant complaint is that of back pain- compatible with recent surgery. She reports a remote/single episode history of kidney stones back around 1994/1995.  Past Medical History  Diagnosis Date  . PONV (postoperative nausea and vomiting)   . Hypertension   . Shortness of breath   . Sleep apnea     does not use cpap but has one  . Heart murmur   . GERD (gastroesophageal reflux disease)   . Cancer     melanoma lft arm  . Anemia     hx    Past Surgical History  Procedure Date  . Cholecystectomy 81  . Appendectomy   . Dilation and curettage of uterus 90  . Carpal tunnel release 98    rt  . Trigeminal nerve decompression 2000    gamma knife    History reviewed. No pertinent family history.  Social History:  reports that she quit  smoking about 46 years ago. Her smoking use included Cigarettes. She has a 1.5 pack-year smoking history. She does not have any smokeless tobacco history on file. She reports that she does not drink alcohol or use illicit drugs.  Allergies:  Allergies  Allergen Reactions  . Codeine Other (See Comments)    Stomach spasms  . Talwin (Pentazocine) Nausea And Vomiting  . Tape Other (See Comments)    "if adhesive left on for any length of time area turns red, bumps"    Medications:  Scheduled:   . acetaminophen      . allopurinol  300 mg Oral Daily  . aspirin EC  81 mg Oral Daily  . atenolol  50 mg Oral Daily  . atorvastatin  40 mg Oral QHS  . bacitracin      . ceFAZolin      .  ceFAZolin (ANCEF) IV  1 g Intravenous Q8H  .  ceFAZolin (ANCEF) IV  2 g Intravenous 60 min Pre-Op  . docusate sodium  100 mg Oral BID  . glimepiride  4 mg Oral BID WC  . HYDROmorphone      . HYDROmorphone      . insulin glargine  54 Units Subcutaneous QHS  . levothyroxine  125 mcg Oral QAC breakfast  . multivitamin with minerals  1 tablet Oral Daily  . pantoprazole  40 mg Oral Q1200  . pantoprazole (PROTONIX) IV  40 mg Intravenous QHS  . pioglitazone  45 mg Oral Daily  . rOPINIRole  1 mg Oral QHS  . sodium  chloride  1,000 mL Intravenous Once  . sodium chloride      . sodium chloride  3 mL Intravenous Q12H  . traMADol  50 mg Oral Q6H  . vitamin B-12  1,000 mcg Oral Daily  . DISCONTD: celecoxib  200 mg Oral BID  . DISCONTD: glimepiride  4 mg Oral BID  . DISCONTD: losartan  100 mg Oral Daily  . DISCONTD: metFORMIN  1,000 mg Oral BID WC  . DISCONTD: metFORMIN  1,000 mg Oral BID WC  . DISCONTD: potassium chloride SA  20 mEq Oral Daily  . DISCONTD: triamterene-hydrochlorothiazide  1 tablet Oral Daily    Results for orders placed during the hospital encounter of 05/25/12 (from the past 48 hour(s))  GLUCOSE, CAPILLARY     Status: Abnormal   Collection Time   05/25/12  6:50 AM      Component Value Range  Comment   Glucose-Capillary 172 (*) 70 - 99 mg/dL   GLUCOSE, CAPILLARY     Status: Abnormal   Collection Time   05/25/12  2:40 PM      Component Value Range Comment   Glucose-Capillary 222 (*) 70 - 99 mg/dL    Comment 1 Documented in Chart      Comment 2 Notify RN     BASIC METABOLIC PANEL     Status: Abnormal   Collection Time   05/26/12  9:27 AM      Component Value Range Comment   Sodium 132 (*) 135 - 145 mEq/L    Potassium 4.7  3.5 - 5.1 mEq/L    Chloride 100  96 - 112 mEq/L    CO2 22  19 - 32 mEq/L    Glucose, Bld 208 (*) 70 - 99 mg/dL    BUN 37 (*) 6 - 23 mg/dL    Creatinine, Ser 1.61 (*) 0.50 - 1.10 mg/dL    Calcium 8.5  8.4 - 09.6 mg/dL    GFR calc non Af Amer 24 (*) >90 mL/min    GFR calc Af Amer 27 (*) >90 mL/min   CBC WITH DIFFERENTIAL     Status: Abnormal   Collection Time   05/26/12  9:27 AM      Component Value Range Comment   WBC 15.2 (*) 4.0 - 10.5 K/uL    RBC 3.17 (*) 3.87 - 5.11 MIL/uL    Hemoglobin 8.6 (*) 12.0 - 15.0 g/dL    HCT 04.5 (*) 40.9 - 46.0 %    MCV 83.3  78.0 - 100.0 fL    MCH 27.1  26.0 - 34.0 pg    MCHC 32.6  30.0 - 36.0 g/dL    RDW 81.1 (*) 91.4 - 15.5 %    Platelets 215  150 - 400 K/uL    Neutrophils Relative 67  43 - 77 %    Neutro Abs 10.2 (*) 1.7 - 7.7 K/uL    Lymphocytes Relative 19  12 - 46 %    Lymphs Abs 2.8  0.7 - 4.0 K/uL    Monocytes Relative 14 (*) 3 - 12 %    Monocytes Absolute 2.2 (*) 0.1 - 1.0 K/uL    Eosinophils Relative 0  0 - 5 %    Eosinophils Absolute 0.0  0.0 - 0.7 K/uL    Basophils Relative 0  0 - 1 %    Basophils Absolute 0.0  0.0 - 0.1 K/uL     Dg Lumbar Spine 2-3 Views  05/25/2012  *RADIOLOGY REPORT*  Clinical data:  Lumbar fusion  LUMBAR SPINE TWO-VIEW  Comparison: 04/18/2012  Findings:  Two intraoperative lumbar spine fluoroscopic spot radiographs document changes of bilateral pedicle screw placement L2-S1.  Graft markers project in the intervening interspaces.  IMPRESSION: 1.  P L I F L2-S1.   Original Report  Authenticated By: Osa Craver, M.D.    Dg Lumbar Spine 1 View  05/25/2012  *RADIOLOGY REPORT*  Clinical Data: L2-S1 PLIF.  LUMBAR SPINE - 1 VIEW  Comparison: 04/19/2012.  Findings: A single intraoperative cross-table lateral view of the lumbar spine, taken at 0950 hours, is submitted.  Surgical instrument tip projects over the L4-5 facet joints.  Endplate degenerative changes are seen in the mid and lower lumbar spine. Loss of disc space height at L5-S1.  IMPRESSION: Intraoperative localization, as above.   Original Report Authenticated By: Reyes Ivan, M.D.    Dg C-arm Gt 120 Min  05/25/2012  *RADIOLOGY REPORT*  Clinical data:  Lumbar fusion  LUMBAR SPINE TWO-VIEW  Comparison: 04/18/2012  Findings:  Two intraoperative lumbar spine fluoroscopic spot radiographs document changes of bilateral pedicle screw placement L2-S1.  Graft markers project in the intervening interspaces.  IMPRESSION: 1.  P L I F L2-S1.   Original Report Authenticated By: Osa Craver, M.D.     Review of Systems  Constitutional: Positive for chills and malaise/fatigue. Negative for fever, weight loss and diaphoresis.  HENT: Negative for neck pain.   Eyes: Negative.   Respiratory: Negative.   Cardiovascular: Negative.   Gastrointestinal: Negative.   Genitourinary: Negative.   Musculoskeletal: Positive for back pain. Negative for myalgias, joint pain and falls.  Skin: Negative.   Neurological: Positive for weakness. Negative for tremors, sensory change, speech change, seizures and loss of consciousness.  Endo/Heme/Allergies: Negative.   Psychiatric/Behavioral: Negative for depression, suicidal ideas and hallucinations. The patient is nervous/anxious.   All other systems reviewed and are negative.   Blood pressure 103/35, pulse 69, temperature 97.6 F (36.4 C), temperature source Oral, resp. rate 14, height 5\' 2"  (1.575 m), weight 103.6 kg (228 lb 6.3 oz), SpO2 98.00%. Physical Exam  Nursing note  and vitals reviewed. Constitutional: She is oriented to person, place, and time. She appears well-developed and well-nourished.       Obese middle aged Caucasian woman who appears uncomfortable but in in pleasant disposition  HENT:  Head: Normocephalic and atraumatic.  Mouth/Throat: No oropharyngeal exudate.       Dry oral mucosa/oropharynx  Eyes: Conjunctivae and EOM are normal. Pupils are equal, round, and reactive to light. No scleral icterus.  Neck: Normal range of motion. No JVD present. No tracheal deviation present. No thyromegaly present.  Cardiovascular: Normal rate, regular rhythm and normal heart sounds.  Exam reveals no gallop and no friction rub.   No murmur heard. Respiratory: Effort normal and breath sounds normal. No respiratory distress. She has no wheezes. She has no rales. She exhibits no tenderness.  GI: Soft. Bowel sounds are normal. She exhibits no distension and no mass. There is no rebound and no guarding.  Musculoskeletal: Normal range of motion. She exhibits no edema and no tenderness.  Lymphadenopathy:    She has no cervical adenopathy.  Neurological: She is alert and oriented to person, place, and time. Coordination normal.  Skin: Skin is warm and dry. No rash noted. She is not diaphoretic. No erythema.  Psychiatric: She has a normal mood and affect. Her behavior is normal. Judgment normal.    Assessment/Plan: 1. Acute renal failure on  chronic kidney disease stage III: Given the history, timeline of events and available data base-it appears she has suffered a hemodynamically mediated acute renal failure with transient hypotensive episodes/significant hemoglobin drop of 3 g with ongoing ARB/diuretic therapy as well as ongoing Cox 2 inhibitor use. I will discontinue her losartan, Maxide and Celebrex for now. For safety reasons, I will also go ahead and discontinue her potassium supplementation and metformin for fear of potentiating hyperkalemia/lactic acidosis  respectively. She is unfortunately currently oliguric and I will monitor her on intravenous fluids-we'll switch this to normal saline without any potassium supplementation. I will check a renal ultrasound in order to rule out any structural lesions, urine electrolytes will be sent and a spot urine protein to creatinine ratio determined. No acute indications for hemodialysis noted at this time. Avoid nephrotoxic medications including NSAIDs and contrast exposure. Continue strict Input and Output monitoring. Will monitor the patient closely with you and intervene or adjust therapy as indicated by changes in clinical status/labs.  2. Status post lumbar fusion surgery: Management per Dr. Wynetta Emery, monitor closely on narcotic analgesics for associated hypotension. 3. Hypertension: Anticipate some difficulties with control with holding her losartan/Maxide. We'll supplement control with hydralazine or beta blocker therapy as needed. For now, would tolerate slightly higher blood pressures up to systolics of 160. 4. Anemia: Acute drop noted overnight, would evaluate this further with stool cards-suspected this is not entirely from surgical losses. 5. Hyponatremia: Secondary to acute renal failure and difficulties with free water clearance, adjust intravenous fluid therapy-discontinue her current hypotonic fluids.    Paige Holland K. 05/26/2012, 11:44 AM

## 2012-05-26 NOTE — Progress Notes (Signed)
Inpatient Diabetes Program Recommendations  AACE/ADA: New Consensus Statement on Inpatient Glycemic Control  Target Ranges:  Prepandial:   less than 140 mg/dL      Peak postprandial:   less than 180 mg/dL (1-2 hours)      Critically ill patients:  140 - 180 mg/dL  Pager:  [phone] Hours:  8 am-10pm   Reason for Visit: Elevated glucose and history of Diabetes  Inpatient Diabetes Program Recommendations Correction (SSI): Please check CBGS and add Novolog Correction if needed HgbA1C: Check HgbA1C   Alfredia Client PhD, RN, BC-ADM Diabetes Coordinator  Office:  [phone] Team Pager:  [phone]

## 2012-05-26 NOTE — Progress Notes (Signed)
Subjective: Patient reports Overall systolic a lot of pain in her low back but her legs are fine with no numbness att or tingling  Objective: Vital signs in last 24 hours: Temp:  [97.1 F (36.2 C)-98.4 F (36.9 C)] 98.4 F (36.9 C) (08/29 0400) Pulse Rate:  [61-79] 71  (08/29 0645) Resp:  [2-40] 13  (08/29 0645) BP: (103-154)/(27-57) 112/40 mmHg (08/29 0645) SpO2:  [89 %-100 %] 100 % (08/29 0645) Weight:  [103.6 kg (228 lb 6.3 oz)] 103.6 kg (228 lb 6.3 oz) (08/28 1640)  Intake/Output from previous day: 08/28 0701 - 08/29 0700 In: 3830 [P.O.:100; I.V.:3475; Blood:155; IV Piggyback:100] Out: 1635 [Urine:695; Drains:340; Blood:600] Intake/Output this shift:    right footdrop baseline strength otherwise 5 out of 5  Lab Results: No results found for this basename: WBC:2,HGB:2,HCT:2,PLT:2 in the last 72 hours BMET No results found for this basename: NA:2,K:2,CL:2,CO2:2,GLUCOSE:2,BUN:2,CREATININE:2,CALCIUM:2 in the last 72 hours  Studies/Results: Dg Lumbar Spine 2-3 Views  05/25/2012  *RADIOLOGY REPORT*  Clinical data:  Lumbar fusion  LUMBAR SPINE TWO-VIEW  Comparison: 04/18/2012  Findings:  Two intraoperative lumbar spine fluoroscopic spot radiographs document changes of bilateral pedicle screw placement L2-S1.  Graft markers project in the intervening interspaces.  IMPRESSION: 1.  P L I F L2-S1.   Original Report Authenticated By: Osa Craver, M.D.    Dg Lumbar Spine 1 View  05/25/2012  *RADIOLOGY REPORT*  Clinical Data: L2-S1 PLIF.  LUMBAR SPINE - 1 VIEW  Comparison: 04/19/2012.  Findings: A single intraoperative cross-table lateral view of the lumbar spine, taken at 0950 hours, is submitted.  Surgical instrument tip projects over the L4-5 facet joints.  Endplate degenerative changes are seen in the mid and lower lumbar spine. Loss of disc space height at L5-S1.  IMPRESSION: Intraoperative localization, as above.   Original Report Authenticated By: Paige Holland, M.D.     Dg C-arm Gt 120 Min  05/25/2012  *RADIOLOGY REPORT*  Clinical data:  Lumbar fusion  LUMBAR SPINE TWO-VIEW  Comparison: 04/18/2012  Findings:  Two intraoperative lumbar spine fluoroscopic spot radiographs document changes of bilateral pedicle screw placement L2-S1.  Graft markers project in the intervening interspaces.  IMPRESSION: 1.  P L I F L2-S1.   Original Report Authenticated By: Osa Craver, M.D.     Assessment/Plan: Progressive mobilization with physical therapy urine output and a little bit low so we will bolus her with liter saline will check a BMP and CBC drain output has been reasonable 380 overnight. Continue observation the ICU our washer urine output and her blood pressure  LOS: 1 day     Briel Gallicchio P 05/26/2012, 7:20 AM

## 2012-05-26 NOTE — Progress Notes (Signed)
OT EVALUATION   05/26/12 1000  OT Visit Information  Last OT Received On 05/26/12  Assistance Needed +2  PT/OT Co-Evaluation/Treatment Yes  OT Time Calculation  OT Start Time 1113  OT Stop Time 1140  OT Time Calculation (min) 27 min  Subjective Data  Subjective "I can't ...wait .Marland KitchenMarland KitchenOh do we have to do this? " - pt very anxious to get oob and fearful. pt able to progress during session with coaching and extended timeq  Patient Stated Goal to remain in bed the rest of the day  Precautions  Precautions Back  Restrictions  Weight Bearing Restrictions No  Home Living  Lives With Son  Available Help at Discharge Family  Type of Home House  Home Access Stairs to enter  Entrance Stairs-Number of Steps 3 in the front and the back   Entrance Stairs-Rails None  Home Layout One level  Bathroom Nurse, children's Yes  How Accessible Accessible via walker  Home Adaptive Equipment None  Prior Function  Level of Independence Needs assistance  Needs Assistance Meal Prep;Light Housekeeping (Unable to stand long enough to complete tasks)  Able to Take Stairs? Yes  Driving Yes  Vocation Retired  Comments Son cooked all meals and cleans the house for pt.  Communication  Communication No difficulties  ADL  Grooming Performed;Wash/dry face;+1 Total assistance  Where Assessed - Grooming Supported sitting (required (A) to wipe face due to supporting with BIL UE)  Lower Body Dressing Performed;+1 Total assistance  Where Assessed - Lower Body Dressing Supine, head of bed up  Equipment Used Gait belt;Back brace;Rolling walker  Transfers/Ambulation Related to ADLs Not completed this session due to patient anxious and painful  ADL Comments Pt (A)ed to EOB with increased time and strong encouragement. pt c/o "i am going to pass out" however all vitals stable and monitored very closely Pt will progress well with incr participation  Vision -  History  Baseline Vision Wears glasses all the time  Cognition  Overall Cognitive Status Appears within functional limits for tasks assessed/performed  Arousal/Alertness Lethargic  Orientation Level Appears intact for tasks assessed  Behavior During Session [place] for tasks performed  Right Upper Extremity Assessment  RUE ROM/Strength/Tone [facility] for tasks assessed  Left Upper Extremity Assessment  LUE ROM/Strength/Tone WFL for tasks assessed  Trunk Assessment  Trunk Assessment Other exceptions (back surg)  Bed Mobility  Bed Mobility Supine to Sit;Left Sidelying to Sit;Rolling Left;Rolling Right  Rolling Right 2: Max assist  Rolling Left 2: Max assist  Left Sidelying to Sit 2: Max assist;HOB elevated  Supine to Sit 1: +2 Total assist;HOB elevated  Supine to Sit: Patient Percentage 10%  Details for Bed Mobility Assistance Pt required strong encouragement and pt at times resisting mobility. Pt grabbing sheets and bed rail stating wait at times during mobility.  Transfers  Transfers Sit to Stand;Stand to Sit  Sit to Stand 3: Mod assist;With upper extremity assist;From bed;From elevated surface  Stand to Sit 3: Mod assist;With upper extremity assist;To bed;To elevated surface  Details for Transfer Assistance Bed height incr-ed to provide confidence to the patient and decr risk of incr pain during transfer. Pt very reluctant to try initially. Pt attempting to sit premature with max v/c from therapist not to sit. Pt required (A) to descend to bed controlled. Pt will sit prematurely so total+2 with chair recommended for mobility during session  OT - End of Session  Activity Tolerance Patient limited by pain;Patient  limited by fatigue  Patient left in bed;with call bell/phone within reach  Nurse Communication Mobility status  OT Assessment  Clinical Impression Statement 71 yo female s/p plif L2-S1 that could benefit from skilled OT acutely. Pt limited by arousal and pain . Recommend SNF for d/c  planning  OT Recommendation/Assessment Patient needs continued OT Services  OT Problem List Decreased strength;Decreased activity tolerance;Impaired balance (sitting and/or standing);Decreased knowledge of use of DME or AE;Decreased safety awareness;Decreased knowledge of precautions;Pain  OT Therapy Diagnosis  Generalized weakness;Acute pain  OT Plan  OT Frequency Min 2X/week  OT Treatment/Interventions Self-care/ADL training;DME and/or AE instruction;Therapeutic activities;Patient/family education;Balance training  OT Recommendation  Follow Up Recommendations Skilled nursing facility  Equipment Recommended Rolling walker with 5" wheels;3 in 1 bedside comode;Defer to next venue  Individuals Consulted  Consulted and Agree with Results and Recommendations Patient  Acute Rehab OT Goals  OT Goal Formulation With patient  Time For Goal Achievement 06/09/12  Potential to Achieve Goals Good  ADL Goals  Pt Will Perform Grooming with set-up;Sitting, chair  Pt Will Perform Upper Body Bathing with set-up;Sitting, chair;Unsupported  Pt Will Perform Upper Body Dressing with set-up;Sitting, chair;Unsupported  Pt Will Perform Lower Body Dressing with min assist;Sit to stand from chair;Supported;with adaptive equipment  ADL Goal: Grooming - Progress Goal set today  ADL Goal: Upper Body Bathing - Progress Goal set today  ADL Goal: Upper Body Dressing - Progress Goal set today  ADL Goal: Lower Body Dressing - Progress Goal set today  Miscellaneous OT Goals  Miscellaneous OT Goal #1 Pt will complete bed mobility with HOB 30 degrees or less with rail Min (A) as precursor to adls  OT Goal: Miscellaneous Goal #1 - Progress Goal set today  OT General Charges  $OT Visit 1 Procedure  OT Evaluation  $Initial OT Evaluation Tier II 1 Procedure  OT Treatments  $Self Care/Home Management  8-22 mins  Written Expression  Dominant Hand  Right   Lucile Shutters   OTR/L Pager: [phone] Office:  [phone] .

## 2012-05-26 NOTE — Progress Notes (Signed)
05/26/12 0600  Vitals  Pulse Rate 72   ECG Heart Rate 73   BP ! 104/37 mmHg  MAP (mmHg) 53   Resp 10      Pt's urine output has decreased over the past 4 hours. Bladder Scan performed; resulting in 0 mL. BP has slightly decreased.Will reposition and continue to monitor.

## 2012-05-26 NOTE — Clinical Documentation Improvement (Signed)
Anemia Blood Loss Clarification  THIS DOCUMENT IS NOT A PERMANENT PART OF THE MEDICAL RECORD         05/26/12  Dear Lowella Petties Marton Redwood  In an effort to better capture your patient's severity of illness, reflect appropriate length of stay and utilization of resources, a review of the patient medical record has revealed the following indicators.   Based on your clinical judgment, please clarify and document in a progress note and/or discharge summary the clinical condition associated with the following supporting information: In responding to this query please exercise your independent judgment.  The fact that a query is asked, does not imply that any particular answer is desired or expected.    Hello Dr. Wynetta Emery!  Nakeisha was admitted for surgery on 8/28. Surgery was completed on 8/28 with a EBL of . Laboratory values show a drop in H&H. If possible, please help clarify the suspected diagnosis. Thank you!   Possible Clinical Conditions?  - Expected Acute Blood Loss Anemia  - Acute Blood Loss Anemia  - Other condition (please document in the progress notes and/or discharge summary)  - Cannot Clinically determine at this time    Additional Supporting Information:  Component Hemoglobin HCT  Latest Ref Rng 12.0 - 15.0 g/dL 16.1 - 09.6 %  0/45/4098 11.6 (L) 36.7  05/26/2012 8.6 (L) 26.4 (L)   - "urine output and a little bit low so we will bolus her with liter saline will check a BMP and CBC drain output has been reasonable 380 overnight" progress note 8/29   additional documentation in chart upon review. SM    Thank You,  Saul Fordyce  Clinical Documentation Specialist: [phone] Pager  Health Information Management [place]

## 2012-05-26 NOTE — Evaluation (Signed)
Physical Therapy Evaluation Patient Details Name: Paige Holland MRN: [id number] DOB: [birth year] Today's Date: 05/26/2012 Time: 4098-1191 PT Time Calculation (min): 27 min  PT Assessment / Plan / Recommendation Clinical Impression  71 yo female s/p plif L2-S1 that could benefit from skilled PT acutely. Pt limited by arousal and pain . Recommend SNF for d/c planning    PT Assessment  Patient needs continued PT services    Follow Up Recommendations  Skilled nursing facility    Barriers to Discharge        Equipment Recommendations  Rolling walker with 5" wheels;3 in 1 bedside comode;Defer to next venue    Recommendations for Other Services     Frequency Min 5X/week    Precautions / Restrictions Precautions Precautions: Back Restrictions Weight Bearing Restrictions: No   Pertinent Vitals/Pain       Mobility  Bed Mobility Bed Mobility: Supine to Sit;Left Sidelying to Sit;Rolling Left;Rolling Right Rolling Right: 2: Max assist Rolling Left: 2: Max assist Left Sidelying to Sit: 2: Max assist;HOB elevated Supine to Sit: 1: +2 Total assist;HOB elevated Supine to Sit: Patient Percentage: 10% Details for Bed Mobility Assistance: Pt required strong encouragement and pt at times resisting mobility. Pt grabbing sheets and bed rail stating wait at times during mobility. Transfers Transfers: Sit to Stand;Stand to Sit Sit to Stand: 3: Mod assist;With upper extremity assist;From bed;From elevated surface Stand to Sit: 3: Mod assist;With upper extremity assist;To bed;To elevated surface Details for Transfer Assistance: Bed height incr-ed to provide confidence to the patient and decr risk of incr pain during transfer. Pt very reluctant to try initially. Pt attempting to sit premature with max v/c from therapist not to sit. Pt required (A) to descend to bed controlled. Pt will sit prematurely so total+2 with chair recommended for mobility during session Ambulation/Gait Ambulation/Gait  Assistance: Not tested (comment) Stairs: No Wheelchair Mobility Wheelchair Mobility: No    Exercises     PT Diagnosis: Difficulty walking;Generalized weakness;Acute pain  PT Problem List: Decreased strength;Decreased activity tolerance;Decreased balance;Decreased mobility;Decreased knowledge of use of DME;Pain PT Treatment Interventions: DME instruction;Gait training;Functional mobility training;Therapeutic activities;Patient/family education   PT Goals Acute Rehab PT Goals PT Goal Formulation: With patient Time For Goal Achievement: 06/09/12 Potential to Achieve Goals: Good Pt will go Supine/Side to Sit: with min assist PT Goal: Supine/Side to Sit - Progress: Goal set today Pt will go Sit to Stand: with min assist PT Goal: Sit to Stand - Progress: Goal set today Pt will Transfer Bed to Chair/Chair to Bed: with min assist PT Transfer Goal: Bed to Chair/Chair to Bed - Progress: Goal set today Pt will Ambulate: 51 - 150 feet;with min assist;with least restrictive assistive device PT Goal: Ambulate - Progress: Goal set today  Visit Information  Last PT Received On: 05/26/12 Assistance Needed: +2 PT/OT Co-Evaluation/Treatment: Yes    Subjective Data  Subjective: I can't do this... I'm not scared, I hurt... Patient Stated Goal: get home, get rid of the pain   Prior Functioning  Home Living Lives With: Son Available Help at Discharge: Family Type of Home: House Home Access: Stairs to enter [employer] of Steps: 3 in the front and the back  Entrance Stairs-Rails: None Home Layout: One level Bathroom Shower/Tub: Engineer, manufacturing systems: Standard Bathroom Accessibility: Yes How Accessible: Accessible via walker Home Adaptive Equipment: None Prior Function Level of Independence: Needs assistance Needs Assistance: Meal Prep;Light Housekeeping Able to Take Stairs?: Yes Driving: Yes Vocation: Retired Musician: No difficulties Dominant  Hand: Right    Cognition  Overall Cognitive Status: Appears within functional limits for tasks assessed/performed Arousal/Alertness: Lethargic Orientation Level: Appears intact for tasks assessed Behavior During Session: [facility] for tasks performed    Extremity/Trunk Assessment Right Upper Extremity Assessment RUE ROM/Strength/Tone: [facility] for tasks assessed Left Upper Extremity Assessment LUE ROM/Strength/Tone: [facility] for tasks assessed Right Lower Extremity Assessment RLE ROM/Strength/Tone: [facility] for tasks assessed;Deficits RLE ROM/Strength/Tone Deficits: BIL LE grossly weak and stiff Left Lower Extremity Assessment LLE ROM/Strength/Tone: WFL for tasks assessed;Deficits LLE ROM/Strength/Tone Deficits: grossly weak and stiff Trunk Assessment Trunk Assessment: Other exceptions (back surg)   Balance Balance Balance Assessed: No  End of Session PT - End of Session Equipment Utilized During Treatment: Gait belt Activity Tolerance: Patient limited by pain;Patient tolerated treatment well;Other (comment) (and also fearful) Patient left: in bed;with call bell/phone within reach Nurse Communication: Mobility status  GP     Robbie Rideaux, Eliseo Gum 05/26/2012, 4:11 PM  05/26/2012  [place] Bing, PT [phone] (pager)

## 2012-05-27 LAB — BASIC METABOLIC PANEL
BUN: 29 mg/dL — ABNORMAL HIGH (ref 6–23)
CO2: 20 mEq/L (ref 19–32)
CO2: 20 mEq/L (ref 19–32)
Calcium: 7.9 mg/dL — ABNORMAL LOW (ref 8.4–10.5)
Chloride: 104 mEq/L (ref 96–112)
Chloride: 104 mEq/L (ref 96–112)
Creatinine, Ser: 1.87 mg/dL — ABNORMAL HIGH (ref 0.50–1.10)
Creatinine, Ser: 1.33 mg/dL — ABNORMAL HIGH (ref 0.50–1.10)
Glucose, Bld: 99 mg/dL (ref 70–99)
Potassium: 4.4 mEq/L (ref 3.5–5.1)

## 2012-05-27 LAB — GLUCOSE, CAPILLARY
Glucose-Capillary: 61 mg/dL — ABNORMAL LOW (ref 70–99)
Glucose-Capillary: 69 mg/dL — ABNORMAL LOW (ref 70–99)
Glucose-Capillary: 75 mg/dL (ref 70–99)
Glucose-Capillary: 106 mg/dL — ABNORMAL HIGH (ref 70–99)
Glucose-Capillary: 142 mg/dL — ABNORMAL HIGH (ref 70–99)

## 2012-05-27 LAB — CBC WITH DIFFERENTIAL/PLATELET
Eosinophils Relative: 2 % (ref 0–5)
HCT: 23.6 % — ABNORMAL LOW (ref 36.0–46.0)
Lymphs Abs: 2.1 10*3/uL (ref 0.7–4.0)
MCH: 27 pg (ref 26.0–34.0)
MCV: 83.7 fL (ref 78.0–100.0)
Monocytes Absolute: 1.8 10*3/uL — ABNORMAL HIGH (ref 0.1–1.0)
Monocytes Relative: 14 % — ABNORMAL HIGH (ref 3–12)
Neutro Abs: 8.7 10*3/uL — ABNORMAL HIGH (ref 1.7–7.7)
Platelets: 153 10*3/uL (ref 150–400)
RBC: 2.82 MIL/uL — ABNORMAL LOW (ref 3.87–5.11)
RDW: 17.3 % — ABNORMAL HIGH (ref 11.5–15.5)

## 2012-05-27 LAB — PREPARE RBC (CROSSMATCH)

## 2012-05-27 MED ORDER — TRAMADOL HCL 50 MG PO TABS
50.0000 mg | ORAL_TABLET | Freq: Four times a day (QID) | ORAL | Status: DC
  Administered 2012-05-27 – 2012-06-02 (×23): 50 mg via ORAL
  Filled 2012-05-27 (×36): qty 1

## 2012-05-27 MED ORDER — SODIUM CHLORIDE 0.9 % IV BOLUS (SEPSIS)
250.0000 mL | Freq: Once | INTRAVENOUS | Status: AC
  Administered 2012-05-27: 250 mL via INTRAVENOUS

## 2012-05-27 MED FILL — Heparin Sodium (Porcine) Inj 1000 Unit/ML: INTRAMUSCULAR | Qty: 30 | Status: AC

## 2012-05-27 MED FILL — Sodium Chloride Irrigation Soln 0.9%: Qty: 3000 | Status: AC

## 2012-05-27 MED FILL — Sodium Chloride IV Soln 0.9%: INTRAVENOUS | Qty: 1000 | Status: AC

## 2012-05-27 NOTE — Progress Notes (Signed)
Patient ID: Paige Holland, female   DOB: [birth year], 71 y.o.   MRN: [id number]   [facility] Progress Note    Subjective:   Transient hypotension overnight- responded well to fluid boluses and UOP picking up   Objective:   BP 104/37  Pulse 74  Temp 98.8 F (37.1 C) (Oral)  Resp 13  Ht 5\' 2"  (1.575 m)  Wt 103.6 kg (228 lb 6.3 oz)  BMI 41.77 kg/m2  SpO2 93%  Intake/Output Summary (Last 24 hours) at 05/27/12 4098 Last data filed at 05/27/12 0600  Gross per 24 hour  Intake 1780.83 ml  Output   1370 ml  Net 410.83 ml   Weight change:   Physical Exam: JXB:[id number] resting in bed YQM:VHQIO RRR, normal S1 and S2  Resp:CTA bilaterally, normal S1 and S2  NGE:XBMW, obese, NT, BS normal Ext:No LE edema  Imaging: Dg Lumbar Spine 2-3 Views  05/25/2012  *RADIOLOGY REPORT*  Clinical data:  Lumbar fusion  LUMBAR SPINE TWO-VIEW  Comparison: 04/18/2012  Findings:  Two intraoperative lumbar spine fluoroscopic spot radiographs document changes of bilateral pedicle screw placement L2-S1.  Graft markers project in the intervening interspaces.  IMPRESSION: 1.  P L I F L2-S1.   Original Report Authenticated By: Osa Craver, M.D.    Dg Lumbar Spine 1 View  05/25/2012  *RADIOLOGY REPORT*  Clinical Data: L2-S1 PLIF.  LUMBAR SPINE - 1 VIEW  Comparison: 04/19/2012.  Findings: A single intraoperative cross-table lateral view of the lumbar spine, taken at 0950 hours, is submitted.  Surgical instrument tip projects over the L4-5 facet joints.  Endplate degenerative changes are seen in the mid and lower lumbar spine. Loss of disc space height at L5-S1.  IMPRESSION: Intraoperative localization, as above.   Original Report Authenticated By: Reyes Ivan, M.D.    US Renal  05/26/2012  *RADIOLOGY REPORT*  Clinical Data:  Acute renal failure.  Preexisting chronic kidney disease stage III.  RENAL/URINARY TRACT ULTRASOUND COMPLETE  Comparison:  None.  Findings:  Right Kidney:   Slightly increased in size and normal parenchymal echogenicity.  Normal length of 16.6 cm.  Left Kidney:  Slightly decreased in size with normal parenchymal echogenicity.  No evidence of mass or hydronephrosis.  Bladder:  Bladder was not distended.  IMPRESSION: Slight asymmetry in renal size but normal parenchymal echogenicity and no hydronephrosis.   Original Report Authenticated By: Elsie Stain, M.D.    Dg C-arm Gt 120 Min  05/25/2012  *RADIOLOGY REPORT*  Clinical data:  Lumbar fusion  LUMBAR SPINE TWO-VIEW  Comparison: 04/18/2012  Findings:  Two intraoperative lumbar spine fluoroscopic spot radiographs document changes of bilateral pedicle screw placement L2-S1.  Graft markers project in the intervening interspaces.  IMPRESSION: 1.  P L I F L2-S1.   Original Report Authenticated By: Osa Craver, M.D.     Labs: BMET  Lab 05/26/12 1805 05/26/12 0927 05/20/12 1133  NA 130* 132* 137  K 5.0 4.7 4.9  CL 100 100 102  CO2 20 22 18*  GLUCOSE 234* 208* 210*  BUN 40* 37* 27*  CREATININE 2.27* 2.03* 1.39*  ALB -- -- --  CALCIUM 8.0* 8.5 10.0  PHOS -- -- --   CBC  Lab 05/26/12 0927 05/20/12 1133  WBC 15.2* 12.1*  NEUTROABS 10.2* --  HGB 8.6* 11.6*  HCT 26.4* 36.7  MCV 83.3 84.0  PLT 215 317    Medications:      . allopurinol  300 mg Oral  Daily  . aspirin EC  81 mg Oral Daily  . atorvastatin  40 mg Oral QHS  .  ceFAZolin (ANCEF) IV  1 g Intravenous Q8H  . docusate sodium  100 mg Oral BID  . glimepiride  4 mg Oral BID WC  . insulin aspart  0-15 Units Subcutaneous TID WC  . insulin aspart  0-5 Units Subcutaneous QHS  . insulin glargine  54 Units Subcutaneous QHS  . levothyroxine  125 mcg Oral QAC breakfast  . multivitamin with minerals  1 tablet Oral Daily  . pantoprazole  40 mg Oral Q1200  . pioglitazone  45 mg Oral Daily  . rOPINIRole  1 mg Oral QHS  . sodium chloride  250 mL Intravenous Once  . sodium chloride  250 mL Intravenous Once  . sodium chloride  250 mL  Intravenous Once  . sodium chloride  250 mL Intravenous Once  . sodium chloride  3 mL Intravenous Q12H  . traMADol  50 mg Oral Q6H  . vitamin B-12  1,000 mcg Oral Daily  . DISCONTD: atenolol  50 mg Oral Daily  . DISCONTD: celecoxib  200 mg Oral BID  . DISCONTD: losartan  100 mg Oral Daily  . DISCONTD: metFORMIN  1,000 mg Oral BID WC  . DISCONTD: pantoprazole (PROTONIX) IV  40 mg Intravenous QHS  . DISCONTD: potassium chloride SA  20 mEq Oral Daily  . DISCONTD: traMADol  50 mg Oral Q6H  . DISCONTD: triamterene-hydrochlorothiazide  1 tablet Oral Daily     Assessment/ Plan:   1. Acute renal failure on chronic kidney disease stage III: Hemodynamically mediated ARF- UOP improving with reduced rate of creatinine rise, anticipate renal recovery will ensue soon. Continue NS at current rate and limit potassium.  2. Status post lumbar fusion surgery: Management per Dr. Wynetta Emery, monitor closely on narcotic analgesics for associated hypotension.  3. Hypertension: Currently hypotensive, will hold atenolol (along with her previously held ARB/diuretics).  4. Anemia: Acute drop noted overnight, would evaluate this further with stool cards-suspected this is not entirely from surgical losses.  5. Hyponatremia: Secondary to acute renal failure and difficulties with free water clearance, adjust intravenous fluid therapy-discontinued her current hypotonic fluids.   Zetta Bills, MD 05/27/2012, 8:21 AM

## 2012-05-27 NOTE — Progress Notes (Signed)
Pt's BP decreased. Notified Dr. Eliott Nine. Orders for a 250cc bolus of normal saline. Will continue to monitor.

## 2012-05-27 NOTE — Progress Notes (Signed)
Notified Dr. Mikey Bussing that the Pt's BP did not improve after the first 250cc NS bolus. Dr. Eliott Nine ordered another 250cc NS bolus and increase maintenance fluids to 100cc/hr. Will continue to monitor.

## 2012-05-27 NOTE — Progress Notes (Signed)
PT/OT Cancellation Note    Treatment cancelled today due to pt's BP's have been low and pt state "doctor said I didn't have to work with you today". 05/27/2012  [place] Bing, PT [phone] (pager)

## 2012-05-27 NOTE — Progress Notes (Signed)
I agree with the following treatment note after reviewing documentation.   Johnston, Kizzy Olafson Brynn   OTR/L Pager: [phone] Office: [phone] .   

## 2012-05-27 NOTE — Progress Notes (Addendum)
Hypoglycemic Event  CBG: 61  Treatment: 15 GM carbohydrate snack 1/2 cup juice  Symptoms: None  Follow-up CBG: Time: 09:11 CBG Result: 61, 1/2 juice given   Follow-up CBG: Time  09:27 CBG Result: 69, 1/2 juice given  Follow up CBG Time: 09:45 CBG Result: 75, will continue to monitor pt for s/s of hypoglycemia   Possible Reasons for Event: Other: pt reports it is not uncommon for pt to be hypoglycemic sometimes in the AM  Comments/MD notified: Dr. Wynetta Emery aware    Holly Bodily  Remember to initiate Hypoglycemia Order Set & complete

## 2012-05-27 NOTE — Progress Notes (Signed)
Subjective: Patient reports Still having a lot of pain or back a little bit of pain her legs  Objective: Vital signs in last 24 hours: Temp:  [98.1 F (36.7 C)-99.4 F (37.4 C)] 99.4 F (37.4 C) (08/30 0800) Pulse Rate:  [65-83] 79  (08/30 0900) Resp:  [2-23] 17  (08/30 0900) BP: (70-134)/(22-97) 134/32 mmHg (08/30 0900) SpO2:  [91 %-100 %] 97 % (08/30 0900)  Intake/Output from previous day: 08/29 0701 - 08/30 0700 In: 2855.8 [I.V.:2705.8; IV Piggyback:150] Out: 1390 [Urine:1100; Drains:290] Intake/Output this shift: Total I/O In: 100 [I.V.:100] Out: 405 [Urine:325; Drains:80]  Strength is at her baseline she is partial footdrop on the right that stable from preop otherwise she is 5 of 5 wounds clean and dry  Lab Results:  [facility] 05/26/12 0927  WBC 15.2*  HGB 8.6*  HCT 26.4*  PLT 215   BMET  Basename 05/26/12 1805 05/26/12 0927  NA 130* 132*  K 5.0 4.7  CL 100 100  CO2 20 22  GLUCOSE 234* 208*  BUN 40* 37*  CREATININE 2.27* 2.03*  CALCIUM 8.0* 8.5    Studies/Results: Dg Lumbar Spine 2-3 Views  05/25/2012  *RADIOLOGY REPORT*  Clinical data:  Lumbar fusion  LUMBAR SPINE TWO-VIEW  Comparison: 04/18/2012  Findings:  Two intraoperative lumbar spine fluoroscopic spot radiographs document changes of bilateral pedicle screw placement L2-S1.  Graft markers project in the intervening interspaces.  IMPRESSION: 1.  P L I F L2-S1.   Original Report Authenticated By: Osa Craver, M.D.    Dg Lumbar Spine 1 View  05/25/2012  *RADIOLOGY REPORT*  Clinical Data: L2-S1 PLIF.  LUMBAR SPINE - 1 VIEW  Comparison: 04/19/2012.  Findings: A single intraoperative cross-table lateral view of the lumbar spine, taken at 0950 hours, is submitted.  Surgical instrument tip projects over the L4-5 facet joints.  Endplate degenerative changes are seen in the mid and lower lumbar spine. Loss of disc space height at L5-S1.  IMPRESSION: Intraoperative localization, as above.   Original Report  Authenticated By: Reyes Ivan, M.D.    US Renal  05/26/2012  *RADIOLOGY REPORT*  Clinical Data:  Acute renal failure.  Preexisting chronic kidney disease stage III.  RENAL/URINARY TRACT ULTRASOUND COMPLETE  Comparison:  None.  Findings:  Right Kidney:  Slightly increased in size and normal parenchymal echogenicity.  Normal length of 16.6 cm.  Left Kidney:  Slightly decreased in size with normal parenchymal echogenicity.  No evidence of mass or hydronephrosis.  Bladder:  Bladder was not distended.  IMPRESSION: Slight asymmetry in renal size but normal parenchymal echogenicity and no hydronephrosis.   Original Report Authenticated By: Elsie Stain, M.D.    Dg C-arm Gt 120 Min  05/25/2012  *RADIOLOGY REPORT*  Clinical data:  Lumbar fusion  LUMBAR SPINE TWO-VIEW  Comparison: 04/18/2012  Findings:  Two intraoperative lumbar spine fluoroscopic spot radiographs document changes of bilateral pedicle screw placement L2-S1.  Graft markers project in the intervening interspaces.  IMPRESSION: 1.  P L I F L2-S1.   Original Report Authenticated By: Osa Craver, M.D.     Assessment/Plan: .Day 2 from a decompressive laminectomy and fusion from L2-S1 patient developed postoperative acute renal failure is being seen by nephrology I does seem to be improving her urine output picked up her creatinine is decreasing she is having some trouble with hypotension but she's respond to boluses and she was 1:30 this evening. She does have a little bit of delusional anemia blood loss and  during surgery was minimal however her crit did drop preop for about 8 point. But yet it is not need a transfusion we will double check another CBC this morning. Is below 24 maybe worthwhile transfuse her to increase her perfusion increase her blood pressure.  LOS: 2 days     Nuvia Hileman P 05/27/2012, 9:31 AM

## 2012-05-28 LAB — TYPE AND SCREEN
ABO/RH(D): O POS
Antibody Screen: NEGATIVE
Unit division: 0

## 2012-05-28 LAB — RENAL FUNCTION PANEL
Albumin: 2.3 g/dL — ABNORMAL LOW (ref 3.5–5.2)
BUN: 26 mg/dL — ABNORMAL HIGH (ref 6–23)
Chloride: 103 mEq/L (ref 96–112)
GFR calc Af Amer: 52 mL/min — ABNORMAL LOW (ref >90)
GFR calc non Af Amer: 45 mL/min — ABNORMAL LOW (ref >90)
Phosphorus: 2.2 mg/dL — ABNORMAL LOW (ref 2.3–4.6)
Potassium: 4.4 mEq/L (ref 3.5–5.1)
Sodium: 133 mEq/L — ABNORMAL LOW (ref 135–145)

## 2012-05-28 LAB — CBC
MCH: 27.6 pg (ref 26.0–34.0)
MCHC: 32.9 g/dL (ref 30.0–36.0)
Platelets: 148 10*3/uL — ABNORMAL LOW (ref 150–400)
RBC: 3.55 MIL/uL — ABNORMAL LOW (ref 3.87–5.11)

## 2012-05-28 LAB — GLUCOSE, CAPILLARY
Glucose-Capillary: 43 mg/dL — CL (ref 70–99)
Glucose-Capillary: 52 mg/dL — ABNORMAL LOW (ref 70–99)
Glucose-Capillary: 154 mg/dL — ABNORMAL HIGH (ref 70–99)

## 2012-05-28 MED ORDER — INSULIN GLARGINE 100 UNIT/ML ~~LOC~~ SOLN
45.0000 [IU] | Freq: Every day | SUBCUTANEOUS | Status: DC
  Administered 2012-05-28: 45 [IU] via SUBCUTANEOUS

## 2012-05-28 NOTE — Progress Notes (Addendum)
Patient ID: Paige Holland, female   DOB: 1941/03/12, 71 y.o.   MRN: [id number]   [facility] Progress Note    Subjective:   Reports to be feeling a little better today- no fluid boluses needed last PM after PRBC transfusion.   Objective:   BP 118/45  Pulse 88  Temp 98.3 F (36.8 C) (Oral)  Resp 19  Ht 5\' 2"  (1.575 m)  Wt 106.7 kg (235 lb 3.7 oz)  BMI 43.02 kg/m2  SpO2 97%  Intake/Output Summary (Last 24 hours) at 05/28/12 4098 Last data filed at 05/28/12 0700  Gross per 24 hour  Intake   3492 ml  Output   2470 ml  Net   1022 ml   Weight change:   Physical Exam: Gen: Comfortably resting in bed, eating breakfast CVS: Pulse regular in rate and rhythm, heart sounds S1 and S2 normal Resp: Clear to auscultation bilaterally, no rales/rhonchi Abd: Soft, obese, nontender and bowel sounds are normal Ext: Trace lower extremity edema  Imaging: US Renal  05/26/2012  *RADIOLOGY REPORT*  Clinical Data:  Acute renal failure.  Preexisting chronic kidney disease stage III.  RENAL/URINARY TRACT ULTRASOUND COMPLETE  Comparison:  None.  Findings:  Right Kidney:  Slightly increased in size and normal parenchymal echogenicity.  Normal length of 16.6 cm.  Left Kidney:  Slightly decreased in size with normal parenchymal echogenicity.  No evidence of mass or hydronephrosis.  Bladder:  Bladder was not distended.  IMPRESSION: Slight asymmetry in renal size but normal parenchymal echogenicity and no hydronephrosis.   Original Report Authenticated By: Elsie Stain, M.D.     Labs: BMET  Lab 05/28/12 1191 05/27/12 2118 05/27/12 1003 05/26/12 1805 05/26/12 0927  NA 133* 133* 134* 130* 132*  K 4.4 4.4 4.2 5.0 4.7  CL 103 104 104 100 100  CO2 19 20 20 20 22   GLUCOSE 68* 117* 99 234* 208*  BUN 26* 29* 37* 40* 37*  CREATININE 1.18* 1.33* 1.87* 2.27* 2.03*  ALB -- -- -- -- --  CALCIUM 8.1* 8.0* 7.9* 8.0* 8.5  PHOS 2.2* -- -- -- --   CBC  Lab 05/28/12 0620 05/27/12 1003 05/26/12  0927  WBC 12.3* 12.9* 15.2*  NEUTROABS -- 8.7* 10.2*  HGB 9.8* 7.6* 8.6*  HCT 29.8* 23.6* 26.4*  MCV 83.9 83.7 83.3  PLT 148* 153 215    Medications:      . allopurinol  300 mg Oral Daily  . aspirin EC  81 mg Oral Daily  . atorvastatin  40 mg Oral QHS  .  ceFAZolin (ANCEF) IV  1 g Intravenous Q8H  . docusate sodium  100 mg Oral BID  . glimepiride  4 mg Oral BID WC  . insulin aspart  0-15 Units Subcutaneous TID WC  . insulin glargine  54 Units Subcutaneous QHS  . levothyroxine  125 mcg Oral QAC breakfast  . multivitamin with minerals  1 tablet Oral Daily  . pantoprazole  40 mg Oral Q1200  . pioglitazone  45 mg Oral Daily  . rOPINIRole  1 mg Oral QHS  . sodium chloride  3 mL Intravenous Q12H  . traMADol  50 mg Oral Q6H  . vitamin B-12  1,000 mcg Oral Daily  . DISCONTD: insulin aspart  0-5 Units Subcutaneous QHS     Assessment/ Plan:   1. Acute renal failure on chronic kidney disease stage III: Hemodynamically mediated ARF- UOP improving with improving renal function/creatinine indicating renal recovery. We'll discontinue NS and  continue to monitor labs off antihypertensive therapy/celecoxib. The plan is to restart her Cozaar tomorrow if renal function remains at baseline and blood pressures are better. Avoid thiazides for now given hyponatremia.  2. Status post lumbar fusion surgery: Management per Dr. Wynetta Emery, monitor closely on narcotic analgesics for associated hypotension.  3. Hypotension: Blood pressures improved off antihypertensive medications, more so after packed red cell transfusion/volume. 4. Anemia: Appropriate hemoglobin improvement/rate of rise following 2 units of packed red blood cell transfusion.  5. Hyponatremia: Secondary to acute renal failure/preceding thiazide use and difficulties with free water clearance, adjust intravenous fluid therapy-discontinued her current hypotonic fluids.   Zetta Bills, MD 05/28/2012, 8:35 AM

## 2012-05-28 NOTE — Progress Notes (Signed)
Subjective: Patient sitting up on side of bed, with lumbar brace on, with the assistance of the physical therapist, who is next to her. Complaining of moderate pain. Nursing staff notes that vital signs up and more stable since transfusions yesterday. CBC much improved today. BMET stable. Nursing reports 150 cc of drainage from Hemovac drain overnight shift.  Objective: Vital signs in last 24 hours: Filed Vitals:   05/28/12 0500 05/28/12 0600 05/28/12 0700 05/28/12 0745  BP: 136/38 120/41 118/45   Pulse: 79 82 79 88  Temp:   98.3 F (36.8 C)   TempSrc:   Oral   Resp: 17 14 12 19   Height:      Weight:  106.7 kg (235 lb 3.7 oz)    SpO2: 100% 96% 100% 97%    Intake/Output from previous day: 08/30 0701 - 08/31 0700 In: 3792 [P.O.:660; I.V.:2440; Blood:642; IV Piggyback:50] Out: 2875 [Urine:2575; Drains:300] Intake/Output this shift:    Physical Exam:  Dressing clean and dry. Moving all 4 extremities well.   CBC  Basename 05/28/12 0620 05/27/12 1003  WBC 12.3* 12.9*  HGB 9.8* 7.6*  HCT 29.8* 23.6*  PLT 148* 153   BMET  Basename 05/28/12 0415 05/27/12 2118  NA 133* 133*  K 4.4 4.4  CL 103 104  CO2 19 20  GLUCOSE 68* 117*  BUN 26* 29*  CREATININE 1.18* 1.33*  CALCIUM 8.1* 8.0*    Assessment/Plan: Patient stabilizing, and making gradual progress. Laboratories and hemodynamics improved. Continue PT and OT. Will leave Hemovac drain in for now. We'll leave Foley in for now so as to closely monitor I's and O's.   Hewitt Shorts, MD 05/28/2012, 9:39 AM

## 2012-05-28 NOTE — Progress Notes (Signed)
CRITICAL VALUE ALERT  Critical value received:  CBG 52  Date of notification:  05/28/12   Time of notification:  0737  Critical value read back:no  Nurse who received alert:  Marlana Latus RN intial Bedside Glucose done with result of 52, 2 cokes given PO  Per standing order protocol F/U CBG  At 814 was 43. Gave milk with sugar and peanut butter. F/U CBG at 0831 was 69 pt eating breakfast and drinking orange juice. F/U CBG at 0914 86   Time of first page:  0914  Responding MD:  Jule Ser (MD on Floor)  Time MD responded: [phone]

## 2012-05-28 NOTE — Progress Notes (Signed)
Physical Therapy Treatment Patient Details Name: Paige Holland MRN: [id number] DOB: Sep 16, 1941 Today's Date: 05/28/2012 Time: 2130-8657 PT Time Calculation (min): 23 min  PT Assessment / Plan / Recommendation Comments on Treatment Session  Pt still limited by pain, although unsure if pt is self-limiting. Pt complainted of pain throughout session, no decrease/increase with any change in position. Pt requires max encouragement for all mobility, RN aware and present throughout session. Continue per plan    Follow Up Recommendations  Skilled nursing facility    Barriers to Discharge        Equipment Recommendations  Rolling walker with 5" wheels;3 in 1 bedside comode;Defer to next venue    Recommendations for Other Services    Frequency Min 5X/week   Plan Discharge plan remains appropriate;Frequency remains appropriate    Precautions / Restrictions Precautions Precautions: Back Restrictions Weight Bearing Restrictions: No   Pertinent Vitals/Pain Pt with 10/10 pain.    Mobility  Holland Mobility Holland Mobility: Rolling Right;Right Sidelying to Sit;Sitting - Scoot to Paige Holland Rolling Right: 2: Max assist Right Sidelying to Sit: 1: +2 Total assist Right Sidelying to Sit: Patient Percentage: 50% Details for Holland Mobility Assistance: VC for proper sequencing to maintain back precautions during transfer. Pt required max encouragement as she complained of pain throughout transfer and didnt't want to go further Transfers Transfers: Sit to Stand;Stand to Sit Sit to Stand: 3: Mod assist;With upper extremity assist;From Holland;From elevated surface Stand to Sit: 3: Mod assist;With upper extremity assist;To Holland;To elevated surface Stand Pivot Transfers: 1: +2 Total assist Stand Pivot Transfers: Patient Percentage: 50% Details for Transfer Assistance: Max encouragement throughout transfer as pt did not want to move. RN assisted with transfer. VC for hand placement. Pt able to remain fully  upright with second stand. Transferred from Holland to chair, pt wanting to sit earlier, cues for safety with descent into chair.  Ambulation/Gait Ambulation/Gait Assistance: Not tested (comment)    Exercises     PT Diagnosis:    PT Problem List:   PT Treatment Interventions:     PT Goals Acute Rehab PT Goals PT Goal: Supine/Side to Sit - Progress: Progressing toward goal PT Goal: Sit to Stand - Progress: Progressing toward goal PT Transfer Goal: Holland to Chair/Chair to Holland - Progress: Progressing toward goal PT Goal: Ambulate - Progress: Not progressing  Visit Information  Last PT Received On: 05/28/12 Assistance Needed: +2    Subjective Data      Cognition  Overall Cognitive Status: Appears within functional limits for tasks assessed/performed Arousal/Alertness: Lethargic Orientation Level: Appears intact for tasks assessed Behavior During Session: Paige Holland for tasks performed    Balance     End of Session PT - End of Session Equipment Utilized During Treatment: Back brace;Gait belt Activity Tolerance: Patient limited by pain Patient left: in chair;with call bell/phone within reach;with family/visitor present Nurse Communication: Mobility status   GP     Paige Holland 05/28/2012, 10:22 AM  05/28/2012 Paige Holland DPT PAGER: [phone] OFFICE: [id number]

## 2012-05-29 LAB — RENAL FUNCTION PANEL
BUN: 20 mg/dL (ref 6–23)
Chloride: 102 mEq/L (ref 96–112)
Glucose, Bld: 71 mg/dL (ref 70–99)
Potassium: 4.1 mEq/L (ref 3.5–5.1)

## 2012-05-29 LAB — GLUCOSE, CAPILLARY
Glucose-Capillary: 63 mg/dL — ABNORMAL LOW (ref 70–99)
Glucose-Capillary: 65 mg/dL — ABNORMAL LOW (ref 70–99)
Glucose-Capillary: 85 mg/dL (ref 70–99)
Glucose-Capillary: 100 mg/dL — ABNORMAL HIGH (ref 70–99)
Glucose-Capillary: 144 mg/dL — ABNORMAL HIGH (ref 70–99)

## 2012-05-29 LAB — CBC
HCT: 29.3 % — ABNORMAL LOW (ref 36.0–46.0)
Hemoglobin: 9.5 g/dL — ABNORMAL LOW (ref 12.0–15.0)
RBC: 3.5 MIL/uL — ABNORMAL LOW (ref 3.87–5.11)
RDW: 16.3 % — ABNORMAL HIGH (ref 11.5–15.5)
WBC: 9.3 10*3/uL (ref 4.0–10.5)

## 2012-05-29 MED ORDER — INSULIN GLARGINE 100 UNIT/ML ~~LOC~~ SOLN
40.0000 [IU] | Freq: Every day | SUBCUTANEOUS | Status: DC
  Administered 2012-05-29 – 2012-05-30 (×2): 40 [IU] via SUBCUTANEOUS

## 2012-05-29 MED ORDER — LOSARTAN POTASSIUM 50 MG PO TABS
100.0000 mg | ORAL_TABLET | Freq: Every day | ORAL | Status: DC
  Administered 2012-05-29 – 2012-06-02 (×5): 100 mg via ORAL
  Filled 2012-05-29 (×5): qty 2

## 2012-05-29 MED ORDER — METFORMIN HCL 500 MG PO TABS
1000.0000 mg | ORAL_TABLET | Freq: Two times a day (BID) | ORAL | Status: DC
  Administered 2012-05-29 – 2012-06-02 (×7): 1000 mg via ORAL
  Filled 2012-05-29 (×11): qty 2

## 2012-05-29 NOTE — Progress Notes (Signed)
Subjective: Patient reports feeling a bit better  Objective: Vital signs in last 24 hours: Temp:  [97.7 F (36.5 C)-99 F (37.2 C)] 98.6 F (37 C) (09/01 0740) Pulse Rate:  [75-101] 101  (09/01 1000) Resp:  [8-22] 19  (09/01 1000) BP: (105-147)/(45-93) 126/51 mmHg (09/01 1000) SpO2:  [93 %-98 %] 96 % (09/01 1000)  Intake/Output from previous day: 08/31 0701 - 09/01 0700 In: -  Out: 1995 [Urine:1925; Drains:70] Intake/Output this shift:    Physical Exam: Full strength both legs.  Neuropathy stable.  Lab Results:  [facility] 05/29/12 0527 05/28/12 0620  WBC 9.3 12.3*  HGB 9.5* 9.8*  HCT 29.3* 29.8*  PLT 171 148*   BMET  Basename 05/29/12 0527 05/28/12 0415  NA 131* 133*  K 4.1 4.4  CL 102 103  CO2 23 19  GLUCOSE 71 68*  BUN 20 26*  CREATININE 0.93 1.18*  CALCIUM 8.6 8.1*    Studies/Results: No results found.  Assessment/Plan: D/C drain.  Transfer to 4 N.  Continue to mobilize with PT.    LOS: 4 days    Dorian Heckle, MD 05/29/2012, 11:01 AM

## 2012-05-29 NOTE — Progress Notes (Addendum)
Patient ID: LYLAH LANTIS, female   DOB: Jan 13, 1941, 71 y.o.   MRN: [id number]   [facility] Progress Note    Subjective:   Transiently hypoglycemic this morning. UOP/renal function continue to improve off IV fluids.   Objective:   BP 115/45  Pulse 94  Temp 98.6 F (37 C) (Oral)  Resp 15  Ht 5\' 2"  (1.575 m)  Wt 106.7 kg (235 lb 3.7 oz)  BMI 43.02 kg/m2  SpO2 95%  Intake/Output Summary (Last 24 hours) at 05/29/12 0831 Last data filed at 05/29/12 0334  Gross per 24 hour  Intake      0 ml  Output   1995 ml  Net  -1995 ml   Weight change:   Physical Exam: WUJ:WJXBJYNWGNF resting in bed AOZ:HYQMV RRR, normal S1 and S2  Resp:CTA bilaterally, no rales/rhonchi HQI:ONGE, obese, NT, BS normal Ext:No LE edema  Imaging: No results found.  Labs: BMET  Lab 05/29/12 0527 05/28/12 0415 05/27/12 2118 05/27/12 1003 05/26/12 1805 05/26/12 0927  NA 131* 133* 133* 134* 130* 132*  K 4.1 4.4 4.4 4.2 5.0 4.7  CL 102 103 104 104 100 100  CO2 23 19 20 20 20 22   GLUCOSE 71 68* 117* 99 234* 208*  BUN 20 26* 29* 37* 40* 37*  CREATININE 0.93 1.18* 1.33* 1.87* 2.27* 2.03*  ALB -- -- -- -- -- --  CALCIUM 8.6 8.1* 8.0* 7.9* 8.0* 8.5  PHOS 1.4* 2.2* -- -- -- --   CBC  Lab 05/29/12 0527 05/28/12 0620 05/27/12 1003 05/26/12 0927  WBC 9.3 12.3* 12.9* 15.2*  NEUTROABS -- -- 8.7* 10.2*  HGB 9.5* 9.8* 7.6* 8.6*  HCT 29.3* 29.8* 23.6* 26.4*  MCV 83.7 83.9 83.7 83.3  PLT 171 148* 153 215    Medications:      . allopurinol  300 mg Oral Daily  . aspirin EC  81 mg Oral Daily  . atorvastatin  40 mg Oral QHS  .  ceFAZolin (ANCEF) IV  1 g Intravenous Q8H  . docusate sodium  100 mg Oral BID  . glimepiride  4 mg Oral BID WC  . insulin aspart  0-15 Units Subcutaneous TID WC  . insulin glargine  45 Units Subcutaneous QHS  . levothyroxine  125 mcg Oral QAC breakfast  . multivitamin with minerals  1 tablet Oral Daily  . pantoprazole  40 mg Oral Q1200  . pioglitazone  45 mg  Oral Daily  . rOPINIRole  1 mg Oral QHS  . sodium chloride  3 mL Intravenous Q12H  . traMADol  50 mg Oral Q6H  . vitamin B-12  1,000 mcg Oral Daily  . DISCONTD: insulin glargine  54 Units Subcutaneous QHS     Assessment/ Plan:   1. Acute renal failure on chronic kidney disease stage III: Hemodynamically mediated ARF- UOP improved renal function recovery back to baseline. Will restart her losartan today and leave off atenolol until blood pressures are significantly higher. Avoid thiazides for now given hyponatremia.  2. Status post lumbar fusion surgery: Management per Dr. Wynetta Emery, monitor closely on narcotic analgesics for associated hypotension.  3. Hypoglycemia: Due to inconsistent PO intake in the hospital- decrease lantus, hold glimepiride and restart metformin.  4. Anemia: Appropriate hemoglobin improvement/rate of rise following 2 units of packed red blood cell transfusion.  5. Hyponatremia: Secondary to acute renal failure/preceding thiazide use and difficulties with free water clearance- monitor with renal recovery.  Restart Glimepiride when sugars higher and restart atenolol when BP  significantly higher Will S/O- call if questions arise.   Zetta Bills, MD 05/29/2012, 8:31 AM

## 2012-05-29 NOTE — Progress Notes (Signed)
2952 CBG 63  1/2 cup of juice given 0630 CBG 65  1/2 cup  Of juice given 0732 CBG 85   1/2 cup of juice given  Paige Holland B

## 2012-05-30 LAB — GLUCOSE, CAPILLARY
Glucose-Capillary: 55 mg/dL — ABNORMAL LOW (ref 70–99)
Glucose-Capillary: 123 mg/dL — ABNORMAL HIGH (ref 70–99)
Glucose-Capillary: 150 mg/dL — ABNORMAL HIGH (ref 70–99)

## 2012-05-30 LAB — RENAL FUNCTION PANEL
Calcium: 8.6 mg/dL (ref 8.4–10.5)
GFR calc Af Amer: 63 mL/min — ABNORMAL LOW (ref >90)
Glucose, Bld: 57 mg/dL — ABNORMAL LOW (ref 70–99)
Phosphorus: 2.2 mg/dL — ABNORMAL LOW (ref 2.3–4.6)
Sodium: 138 mEq/L (ref 135–145)

## 2012-05-30 MED ORDER — DEXTROSE 50 % IV SOLN
50.0000 mL | Freq: Once | INTRAVENOUS | Status: AC | PRN
  Administered 2012-05-30: 25 mL via INTRAVENOUS

## 2012-05-30 MED ORDER — DEXTROSE 50 % IV SOLN
INTRAVENOUS | Status: AC
  Filled 2012-05-30: qty 50

## 2012-05-30 NOTE — Progress Notes (Signed)
Occupational Therapy Treatment Patient Details Name: Paige Holland MRN: [id number] DOB: 1940/11/22 Today's Date: 05/30/2012 Time: 9147-8295 OT Time Calculation (min): 25 min  OT Assessment / Plan / Recommendation Comments on Treatment Session Pt progressing slowly with therapy. Appears to be somewhat self-limiting, possibly out of fear of falling and fear of incr pain?    Follow Up Recommendations  Skilled nursing facility    Barriers to Discharge       Equipment Recommendations  Rolling walker with 5" wheels;3 in 1 bedside comode    Recommendations for Other Services    Frequency     Plan Discharge plan remains appropriate    Precautions / Restrictions Precautions Precautions: Back Precaution Comments: Patient able to recall all back precautions but requires cueing to maintain with activity Required Braces or Orthoses: Spinal Brace Spinal Brace: Lumbar corset;Applied in sitting position Restrictions Weight Bearing Restrictions: No   Pertinent Vitals/Pain Pt reports 8/10 back pain at rest and with movement. RN provided IV pain medication     ADL  Lower Body Dressing: Performed;+1 Total assistance Where Assessed - Lower Body Dressing: Supported sitting Toilet Transfer: Simulated;+2 Total assistance Toilet Transfer: Patient Percentage: 60% Toilet Transfer Method: Surveyor, minerals:  (bed to chair) Toileting - Clothing Manipulation and Hygiene: Simulated;Maximal assistance Where Assessed - Engineer, mining and Hygiene: Standing Equipment Used: Gait belt;Back brace Transfers/Ambulation Related to ADLs: Pt 60% with stand-pivot transfer from bed to chair with 3-4 pivotal steps. Anxious and self-limiting regarding ambulation ADL Comments: increase time for all movements    OT Diagnosis:    OT Problem List:   OT Treatment Interventions:     OT Goals ADL Goals ADL Goal: Grooming - Progress: Progressing toward goals Miscellaneous OT  Goals OT Goal: Miscellaneous Goal #1 - Progress: Progressing toward goals  Visit Information  Last OT Received On: 05/30/12 Assistance Needed: +2 PT/OT Co-Evaluation/Treatment: Yes    Subjective Data      Prior Functioning       Cognition  Overall Cognitive Status: Appears within functional limits for tasks assessed/performed Arousal/Alertness: Awake/alert Orientation Level: Appears intact for tasks assessed Behavior During Session: [facility] for tasks performed    Mobility  Shoulder Instructions Bed Mobility Rolling Left: 2: Max assist;With rail Left Sidelying to Sit: 1: +2 Total assist;With rails Left Sidelying to Sit: Patient Percentage: 70% Details for Bed Mobility Assistance: A for LEs and to elevates shoulders and trunk up out of bed. Cues for log roll technique and positioning throughout Transfers Sit to Stand: 1: +2 Total assist;With upper extremity assist;From bed Sit to Stand: Patient Percentage: 60% Stand to Sit: 1: +2 Total assist;With upper extremity assist;To bed;To chair/3-in-1 Stand to Sit: Patient Percentage: 60% Details for Transfer Assistance: A required to initiate stand and to block BLE as patient not extending knees with transfer. Cues and facilitation for upright posture. Patient stood x2 as first stand patient sat back down without warning.        Exercises      Balance Static Sitting Balance Static Sitting - Balance Support: Feet supported;Right upper extremity supported (or left UE- pt would switch back and forth) Static Sitting - Level of Assistance: 4: Min assist   End of Session OT - End of Session Equipment Utilized During Treatment: Gait belt;Back brace Activity Tolerance: Patient limited by pain Patient left: in chair;with call bell/phone within reach Nurse Communication: Mobility status  GO     Paige Holland 05/30/2012, 1:40 PM

## 2012-05-30 NOTE — Progress Notes (Signed)
Physical Therapy Treatment Patient Details Name: Paige Holland MRN: [id number] DOB: Aug 21, 1941 Today's Date: 05/30/2012 Time: 4098-1191 PT Time Calculation (min): 25 min  PT Assessment / Plan / Recommendation Comments on Treatment Session  Patient still limited by pain and I believe patient is also self limiting. When asked to ambulate to chair patient unable to stand fully resulting in sudden sit back to bed. When recliner was bought to bed for SPT patient able to stand and take steps towards recliner without needing excessive cues. Continue to progress with patient and push ambulation as she will allow    Follow Up Recommendations  Skilled nursing facility    Barriers to Discharge        Equipment Recommendations  Rolling walker with 5" wheels;3 in 1 bedside comode    Recommendations for Other Services    Frequency Min 5X/week   Plan Discharge plan remains appropriate;Frequency remains appropriate    Precautions / Restrictions Precautions Precautions: Back Precaution Comments: Patient able to recall all back precautions but requires cueing to maintain with activity Restrictions Weight Bearing Restrictions: No   Pertinent Vitals/Pain 8/10 back pain. RN aware.     Mobility  Bed Mobility Rolling Left: 2: Max assist;With rail Left Sidelying to Sit: 1: +2 Total assist;With rails Left Sidelying to Sit: Patient Percentage: 70% Details for Bed Mobility Assistance: A for LEs and to elevates shoulders and trunk up out of bed. Cues for log roll technique and positioning throughout Transfers Sit to Stand: 1: +2 Total assist;With upper extremity assist;From bed Sit to Stand: Patient Percentage: 60% Stand to Sit: 1: +2 Total assist;With upper extremity assist;To bed;To chair/3-in-1 Stand to Sit: Patient Percentage: 60% Stand Pivot Transfers: 1: +2 Total assist Stand Pivot Transfers: Patient Percentage: 60% Details for Transfer Assistance: A required to initiate stand and to block BLE  as patient not extending knees with transfer. Cues a facilitation for upright posture. Patient stood x2 as first stand patient sat back down without warning. Patient given cues on safety and positioning with transfer Ambulation/Gait Ambulation/Gait Assistance: Not tested (comment)    Exercises     PT Diagnosis:    PT Problem List:   PT Treatment Interventions:     PT Goals Acute Rehab PT Goals PT Goal: Supine/Side to Sit - Progress: Progressing toward goal PT Goal: Sit to Stand - Progress: Progressing toward goal PT Transfer Goal: Bed to Chair/Chair to Bed - Progress: Progressing toward goal  Visit Information  Last PT Received On: 05/30/12 Assistance Needed: +2 PT/OT Co-Evaluation/Treatment: Yes    Subjective Data      Cognition  Overall Cognitive Status: Appears within functional limits for tasks assessed/performed Arousal/Alertness: Awake/alert Orientation Level: Appears intact for tasks assessed Behavior During Session: [place] for tasks performed Cognition - Other Comments: Patient alert and oriented however appears to be self-limiting with activity due to increased pain.     Balance     End of Session PT - End of Session Equipment Utilized During Treatment: Gait belt;Back brace Activity Tolerance: Patient limited by pain Patient left: in chair;with call bell/phone within reach Nurse Communication: Mobility status   GP     Fredrich Birks 05/30/2012, 9:43 AM 05/30/2012 Fredrich Birks PTA [phone] pager [phone] office

## 2012-05-30 NOTE — Progress Notes (Signed)
Hypoglycemic Event  CBG:55  Treatment: D50 IV 25 mL  Symptoms: None  Follow-up CBG: MWUX:3244 CBG Result:123  Possible Reasons for Event: Medication regimen:   Comments/MD notified:Will continue to monitor patient for S/S of hypoglycemia. CBGs will be checked ACHS.     Irene Pap, RN  Remember to initiate Hypoglycemia Order Set & complete

## 2012-05-30 NOTE — Clinical Social Work Psychosocial (Signed)
     Clinical Social Work Department BRIEF PSYCHOSOCIAL ASSESSMENT 05/30/2012  Patient:  Paige Holland, Paige Holland     Account Number:  [id number]     Admit date:  05/25/2012  Clinical Social Worker:  Burnard Hawthorne  Date/Time:  05/30/2012 03:46 PM  Referred by:  Physician  Date Referred:  05/30/2012 Referred for  SNF Placement   Other Referral:   Interview type:  Patient Other interview type:    PSYCHOSOCIAL DATA Living Status:  WITH ADULT CHILDREN Admitted from facility:   Level of care:   Primary support name:  Elanda Garmany Primary support relationship to patient:  CHILD, ADULT Degree of support available:   Strong support    CURRENT CONCERNS Current Concerns  Post-Acute Placement   Other Concerns:    SOCIAL WORK ASSESSMENT / PLAN Met with patient. She stated that she lives at home with her son Onalee Hua. He is very supportive but he works during the day. She does not have anyone to stay wiht her during the day.  Patient has contacted Ulice Dash at [facility]. She wants to receive short term SNF from this facility. Discussed bed search for [facility]; she agreed but wants first choice to be [facility]. Fl2 will be completed and sent to facility.Bed search will be initiated.   Assessment/plan status:  Psychosocial Support/Ongoing Assessment of Needs Other assessment/ plan:   Information/referral to community resources:   SNF bed search provided    PATIENTS/FAMILYS RESPONSE TO PLAN OF CARE: Patient is alert, oriented and very pleasant. She has a positive attitutde regarding need for SNF placement and is is looking forward to her rehab.  Patient strongly wants to be placed at [facility].

## 2012-05-31 LAB — GLUCOSE, CAPILLARY
Glucose-Capillary: 46 mg/dL — ABNORMAL LOW (ref 70–99)
Glucose-Capillary: 76 mg/dL (ref 70–99)
Glucose-Capillary: 148 mg/dL — ABNORMAL HIGH (ref 70–99)

## 2012-05-31 NOTE — Progress Notes (Signed)
0645 CBG: 46: patient remain awake, alert and oriented x4; offered and pt. drank 2 cans orange juice- patient refused andy additional supplements- CBG to be rechecked in 15 minutes by NST.

## 2012-05-31 NOTE — Progress Notes (Signed)
Hypoglycemic Event  CBG:46  Treatment: 15 GM carbohydrate snack  Symptoms: None  Follow-up CBG: Time:0705 CBG Result:56   Possible Reasons for Event: Inadequate meal intake  Comments/MD notified: Around 0750, pt eating breakfast. CBG 76   Paige Holland  Remember to initiate Hypoglycemia Order Set & complete

## 2012-05-31 NOTE — Progress Notes (Signed)
Physical Therapy Treatment Patient Details Name: Paige Holland MRN: [id number] DOB: [birth year] Today's Date: 05/31/2012 Time: 8413-2440 PT Time Calculation (min): 19 min  PT Assessment / Plan / Recommendation Comments on Treatment Session  Patient progressing and able to ambulate today. Iniitally patient very anxious and just wanting to stand pivot to the recliner. Once ambulating patients mood appeared to change and she became more upbeat and excited to be waking    Follow Up Recommendations  Skilled nursing facility    Barriers to Discharge        Equipment Recommendations  Rolling walker with 5" wheels;3 in 1 bedside comode    Recommendations for Other Services    Frequency Min 5X/week   Plan Discharge plan remains appropriate;Frequency remains appropriate    Precautions / Restrictions Precautions Precaution Comments: Patient able to recall all precautions but requiring cues for no twisting with bed mobility Required Braces or Orthoses: Spinal Brace Spinal Brace: Lumbar corset;Applied in sitting position   Pertinent Vitals/Pain     Mobility  Bed Mobility Rolling Left: 4: Min assist;With rail Left Sidelying to Sit: 3: Mod assist;With rails Details for Bed Mobility Assistance: Cues for no twisting and to push with elbows. A to elevate shoulders into upright sitting Transfers Sit to Stand: 3: Mod assist;From bed;With upper extremity assist Stand to Sit: With upper extremity assist;To chair/3-in-1;4: Min assist Details for Transfer Assistance: A to inititate stand to and A with hip extension. Cues for safe hand placement and not to push up from RW Ambulation/Gait Ambulation/Gait Assistance: 4: Min assist Ambulation Distance (Feet): 40 Feet Assistive device: Rolling walker Ambulation/Gait Assistance Details: Cues and A for safe management of RW. Cues for upright posture.  Gait Pattern: Step-to pattern;Decreased step length - right;Decreased step length - left    Exercises      PT Diagnosis:    PT Problem List:   PT Treatment Interventions:     PT Goals Acute Rehab PT Goals PT Goal: Supine/Side to Sit - Progress: Progressing toward goal PT Goal: Sit to Stand - Progress: Progressing toward goal PT Transfer Goal: Bed to Chair/Chair to Bed - Progress: Progressing toward goal PT Goal: Ambulate - Progress: Progressing toward goal  Visit Information  Last PT Received On: 05/31/12 Assistance Needed: +2 (helpful)    Subjective Data  Subjective: Dont make be sassy with you   Cognition  Overall Cognitive Status: Appears within functional limits for tasks assessed/performed Arousal/Alertness: Awake/alert Orientation Level: Appears intact for tasks assessed Behavior During Session: [facility] for tasks performed    Balance     End of Session PT - End of Session Equipment Utilized During Treatment: Gait belt;Back brace Activity Tolerance: Patient tolerated treatment well Patient left: with call bell/phone within reach;in chair Nurse Communication: Mobility status   GP     Fredrich Birks 05/31/2012, 1:52 PM 05/31/2012 Fredrich Birks PTA [phone] pager [phone] office

## 2012-05-31 NOTE — Clinical Social Work Note (Signed)
CSW met with pt to provide bed offers. Pt stated that she would like [facility] in [place], however they responded "considering." Pt is agreeable to this CSW sending clinical requested by facility. CSW sent clinicals and left a message with admissions coordinator requesting a return phone call. CSW will continue to follow.   Dede Query, MSW, [facility] [phone]

## 2012-06-01 LAB — GLUCOSE, CAPILLARY: Glucose-Capillary: 128 mg/dL — ABNORMAL HIGH (ref 70–99)

## 2012-06-01 NOTE — Clinical Social Work Placement (Addendum)
    Clinical Social Work Department CLINICAL SOCIAL WORK PLACEMENT NOTE 06/01/2012  Patient:  Paige Holland, Paige Holland  Account Number:  [id number] Admit date:  05/25/2012  Clinical Social Worker:  Peggyann Shoals  Date/time:  06/01/2012 11:13 AM  Clinical Social Work is seeking post-discharge placement for this patient at the following level of care:   SKILLED NURSING   (*CSW will update this form in Epic as items are completed)   05/30/2012  Patient/family provided with [facility] Department of Clinical Social Work's list of facilities offering this level of care within the geographic area requested by the patient (or if unable, by the patient's family).  05/30/2012  Patient/family informed of their freedom to choose among providers that offer the needed level of care, that participate in Medicare, Medicaid or managed care program needed by the patient, have an available bed and are willing to accept the patient.  05/30/2012  Patient/family informed of [facility]' ownership interest in [facility], as well as of the fact that they are under no obligation to receive care at this facility.  PASARR submitted to EDS on 05/30/2012 PASARR number received from EDS on 05/30/2012  FL2 transmitted to all facilities in geographic area requested by pt/family on  05/30/2012 FL2 transmitted to all facilities within larger geographic area on   Patient informed that his/her managed care company has contracts with or will negotiate with  certain facilities, including the following:     Patient/family informed of bed offers received:  05/31/2012 Patient chooses bed at [facility] Physician recommends and patient chooses bed at  SNF  Patient to be transferred to  [facility] [place] on 06/02/2012 Patient to be transferred to facility by [facility]  The following physician request were entered in Epic:   Additional Comments:

## 2012-06-01 NOTE — Clinical Social Work Note (Signed)
CSW met with pt to provide bed offers. Pt stated that she would like [facility] [place]. CSW notified facility. CSW left a message with [facility] Medicare regarding prior auth. CSW will continue to follow to facilitate discharge.   Dede Query, MSW, [facility] Majors [phone]

## 2012-06-01 NOTE — Progress Notes (Signed)
Physical Therapy Treatment Patient Details Name: Paige Holland MRN: [id number] DOB: 07-09-1941 Today's Date: 06/01/2012 Time: 7829-5621 PT Time Calculation (min): 25 min  PT Assessment / Plan / Recommendation Comments on Treatment Session  Patient continuing to make progress daily. She has become more motivated over the last several days and is progressing well.     Follow Up Recommendations  Skilled nursing facility    Barriers to Discharge        Equipment Recommendations  Rolling walker with 5" wheels;3 in 1 bedside comode    Recommendations for Other Services    Frequency Min 5X/week   Plan Discharge plan remains appropriate;Frequency remains appropriate    Precautions / Restrictions Precautions Precautions: Back Precaution Comments: Patient able to recall and maintain precautions this session Spinal Brace: Thoracolumbosacral orthotic;Lumbar corset   Pertinent Vitals/Pain     Mobility  Bed Mobility Rolling Right: 4: Min guard;With rail Left Sidelying to Sit: 4: Min guard;With rails Details for Bed Mobility Assistance: Patient able to do without physical assist today but requiring cues for safe technique and MinGuard for safety Transfers Sit to Stand: 4: Min assist;With upper extremity assist;From bed;From chair/3-in-1 Stand to Sit: 4: Min assist;With upper extremity assist;To chair/3-in-1 Details for Transfer Assistance: Cues not to pull up from RW and for safe technique. A to ensure balance as patient flex with stand  Ambulation/Gait Ambulation/Gait Assistance: 4: Min assist Ambulation Distance (Feet): 80 Feet Assistive device: Rolling walker Ambulation/Gait Assistance Details: Cues for posture. A with Rw management with obstacles and for balance Gait Pattern: Step-through pattern;Decreased stride length;Trunk flexed    Exercises     PT Diagnosis:    PT Problem List:   PT Treatment Interventions:     PT Goals Acute Rehab PT Goals PT Goal: Supine/Side to Sit  - Progress: Met PT Transfer Goal: Bed to Chair/Chair to Bed - Progress: Met PT Goal: Ambulate - Progress: Progressing toward goal  Visit Information  Last PT Received On: 06/01/12 Assistance Needed: +1    Subjective Data      Cognition  Overall Cognitive Status: Appears within functional limits for tasks assessed/performed Arousal/Alertness: Awake/alert Orientation Level: Appears intact for tasks assessed Behavior During Session: [employer] for tasks performed    Balance     End of Session PT - End of Session Equipment Utilized During Treatment: Gait belt;Back brace Activity Tolerance: Patient tolerated treatment well Patient left: in chair;with call bell/phone within reach Nurse Communication: Mobility status   GP     Fredrich Birks 06/01/2012, 11:24 AM 06/01/2012 Fredrich Birks PTA [phone] pager [phone] office

## 2012-06-01 NOTE — Progress Notes (Signed)
Occupational Therapy Treatment Patient Details Name: Paige Holland MRN: [id number] DOB: 1940/12/13 Today's Date: 06/01/2012 Time: 4098-1191 OT Time Calculation (min): 26 min  OT Assessment / Plan / Recommendation Comments on Treatment Session Pt. improving with functional mobility.  Initial instruction with AE provided.      Follow Up Recommendations  Skilled nursing facility    Barriers to Discharge       Equipment Recommendations  Rolling walker with 5" wheels;3 in 1 bedside comode;Defer to next venue    Recommendations for Other Services    Frequency Min 2X/week   Plan Discharge plan remains appropriate    Precautions / Restrictions Precautions Precautions: Back Precaution Comments: Patient able to recall and maintain precautions this session Required Braces or Orthoses: Spinal Brace Spinal Brace: Lumbar corset Restrictions Weight Bearing Restrictions: No   Pertinent Vitals/Pain     ADL  Lower Body Dressing:  (Pt. reports she does not wear socks or pants/underpants) Toilet Transfer: Performed;Minimal assistance (and increased time) Toilet Transfer Method: Sit to Barista: Comfort height toilet;Grab bars Toileting - Clothing Manipulation and Hygiene: Simulated;Moderate assistance Where Assessed - Toileting Clothing Manipulation and Hygiene: Standing Equipment Used: Back brace;Rolling walker Transfers/Ambulation Related to ADLs: Pt. ambulates with min A ADL Comments: Pt. instructed in use of AE - pt reports she wears house dresses with no underpants, and no socks at home.  Pt. reports she has a toileting aid at home, and discussed toileting issues with her.   Pt. ambulated to BR with min guard assist    OT Diagnosis:    OT Problem List:   OT Treatment Interventions:     OT Goals Acute Rehab OT Goals OT Goal Formulation: With patient Time For Goal Achievement: 06/09/12 Potential to Achieve Goals: Good ADL Goals ADL Goal: Lower Body  Dressing - Progress: Progressing toward goals Pt Will Transfer to Toilet: with supervision;Comfort height toilet;Ambulation ADL Goal: Toilet Transfer - Progress: Goal set today Pt Will Perform Toileting - Clothing Manipulation: with supervision;Standing ADL Goal: Toileting - Clothing Manipulation - Progress: Goal set today Pt Will Perform Toileting - Hygiene: with supervision;with adaptive equipment;Sit to stand from 3-in-1/toilet ADL Goal: Toileting - Hygiene - Progress: Goal set today  Visit Information  Last OT Received On: 06/01/12 Assistance Needed: +1    Subjective Data      Prior Functioning       Cognition  Overall Cognitive Status: Appears within functional limits for tasks assessed/performed Arousal/Alertness: Awake/alert Orientation Level: Appears intact for tasks assessed Behavior During Session: [facility] for tasks performed    Mobility  Shoulder Instructions Bed Mobility Rolling Right: 4: Min guard;With rail Left Sidelying to Sit: 4: Min guard;With rails Details for Bed Mobility Assistance: Patient able to do without physical assist today but requiring cues for safe technique and MinGuard for safety Transfers Transfers: Sit to Stand;Stand to Sit Sit to Stand: 4: Min assist;With upper extremity assist;From chair/3-in-1 Stand to Sit: 4: Min assist;With upper extremity assist;To chair/3-in-1 Details for Transfer Assistance: min cues for hand placement       Exercises      Balance     End of Session OT - End of Session Equipment Utilized During Treatment: Back brace Activity Tolerance: Patient tolerated treatment well Patient left: in chair;with call bell/phone within reach  GO     Paige Holland M 06/01/2012, 11:34 AM

## 2012-06-02 LAB — GLUCOSE, CAPILLARY: Glucose-Capillary: 143 mg/dL — ABNORMAL HIGH (ref 70–99)

## 2012-06-02 MED ORDER — CYCLOBENZAPRINE HCL 10 MG PO TABS: 10.0000 mg | ORAL_TABLET | Freq: Three times a day (TID) | ORAL | Status: AC | PRN

## 2012-06-02 MED ORDER — OXYCODONE-ACETAMINOPHEN 5-325 MG PO TABS: 1.0000 | ORAL_TABLET | ORAL | Status: AC | PRN

## 2012-06-02 NOTE — Clinical Social Work Note (Signed)
Pt is ready for discharge today to [facility] . Blue Medicare [facility] has been obtained. Facility has received discharge summary and is ready to accept pt. Pt is agreeable to discharge plan. [facility] will provide transportation. CSW is signing off as no further needs identified.   Dede Query, MSW, Theresia Majors [phone]

## 2012-06-02 NOTE — Discharge Summary (Signed)
Physician Discharge Summary  Patient ID: Paige Holland MRN: [id number] DOB/AGE: 29-Nov-1940 71 y.o.  Admit date: 05/25/2012 Discharge date: 06/02/2012  Admission Diagnoses: spondylosis/ stenosis    Discharge Diagnoses: same   Discharged Condition: good  Hospital Course: The patient was admitted on 05/25/2012 and taken to the operating room where the patient underwent multilevel fusion. The patient tolerated the procedure well and was taken to the recovery room and then to the ICU in stable condition. The hospital course was fairly routine. The recovery was slow as expected with such a large, complex surgery. There were no significant complications. The wound remained clean dry and intact. Pt had appropriate back soreness treated with IV and po pain meds. No complaints of leg pain or new N/T/W. The patient remained afebrile with stable vital signs, and tolerated a regular diet. The patient continued to increase activities with PT/OT, and pain was well controlled with oral pain medications. Discharged to SNF.  Significant Diagnostic Studies:  Results for orders placed during the hospital encounter of 05/25/12  GLUCOSE, CAPILLARY      Component Value Range   Glucose-Capillary 172 (*) 70 - 99 mg/dL  GLUCOSE, CAPILLARY      Component Value Range   Glucose-Capillary 222 (*) 70 - 99 mg/dL   Comment 1 Documented in Chart     Comment 2 Notify RN    BASIC METABOLIC PANEL      Component Value Range   Sodium 132 (*) 135 - 145 mEq/L   Potassium 4.7  3.5 - 5.1 mEq/L   Chloride 100  96 - 112 mEq/L   CO2 22  19 - 32 mEq/L   Glucose, Bld 208 (*) 70 - 99 mg/dL   BUN 37 (*) 6 - 23 mg/dL   Creatinine, Ser 8.29 (*) 0.50 - 1.10 mg/dL   Calcium 8.5  8.4 - 56.2 mg/dL   GFR calc non Af Amer 24 (*) >90 mL/min   GFR calc Af Amer 27 (*) >90 mL/min  CBC WITH DIFFERENTIAL      Component Value Range   WBC 15.2 (*) 4.0 - 10.5 K/uL   RBC 3.17 (*) 3.87 - 5.11 MIL/uL   Hemoglobin 8.6 (*) 12.0 - 15.0 g/dL   HCT 13.0 (*) 86.5 - 78.4 %   MCV 83.3  78.0 - 100.0 fL   MCH 27.1  26.0 - 34.0 pg   MCHC 32.6  30.0 - 36.0 g/dL   RDW 69.6 (*) 29.5 - 28.4 %   Platelets 215  150 - 400 K/uL   Neutrophils Relative 67  43 - 77 %   Neutro Abs 10.2 (*) 1.7 - 7.7 K/uL   Lymphocytes Relative 19  12 - 46 %   Lymphs Abs 2.8  0.7 - 4.0 K/uL   Monocytes Relative 14 (*) 3 - 12 %   Monocytes Absolute 2.2 (*) 0.1 - 1.0 K/uL   Eosinophils Relative 0  0 - 5 %   Eosinophils Absolute 0.0  0.0 - 0.7 K/uL   Basophils Relative 0  0 - 1 %   Basophils Absolute 0.0  0.0 - 0.1 K/uL  URINALYSIS, ROUTINE W REFLEX MICROSCOPIC      Component Value Range   Color, Urine YELLOW  YELLOW   APPearance CLOUDY (*) CLEAR   Specific Gravity, Urine 1.024  1.005 - 1.030   pH 5.0  5.0 - 8.0   Glucose, UA NEGATIVE  NEGATIVE mg/dL   Hgb urine dipstick TRACE (*) NEGATIVE   Bilirubin Urine  NEGATIVE  NEGATIVE   Ketones, ur NEGATIVE  NEGATIVE mg/dL   Protein, ur NEGATIVE  NEGATIVE mg/dL   Urobilinogen, UA 0.2  0.0 - 1.0 mg/dL   Nitrite NEGATIVE  NEGATIVE   Leukocytes, UA MODERATE (*) NEGATIVE  UREA NITROGEN, URINE      Component Value Range   Urea Nitrogen, Ur 231    SODIUM, URINE, RANDOM      Component Value Range   Sodium, Ur <10    CREATININE, URINE, RANDOM      Component Value Range   Creatinine, Urine 194.18    PROTEIN / CREATININE RATIO, URINE      Component Value Range   Creatinine, Urine 196.32     Total Protein, Urine 35.7     PROTEIN CREATININE RATIO 0.18 (*) 0.00 - 0.15  BASIC METABOLIC PANEL      Component Value Range   Sodium 130 (*) 135 - 145 mEq/L   Potassium 5.0  3.5 - 5.1 mEq/L   Chloride 100  96 - 112 mEq/L   CO2 20  19 - 32 mEq/L   Glucose, Bld 234 (*) 70 - 99 mg/dL   BUN 40 (*) 6 - 23 mg/dL   Creatinine, Ser 1.61 (*) 0.50 - 1.10 mg/dL   Calcium 8.0 (*) 8.4 - 10.5 mg/dL   GFR calc non Af Amer 21 (*) >90 mL/min   GFR calc Af Amer 24 (*) >90 mL/min  URINE MICROSCOPIC-ADD ON      Component Value Range    Squamous Epithelial / LPF RARE  RARE   WBC, UA 21-50  <3 WBC/hpf   RBC / HPF 0-2  <3 RBC/hpf   Bacteria, UA RARE  RARE   Casts GRANULAR CAST (*) NEGATIVE  BASIC METABOLIC PANEL      Component Value Range   Sodium 133 (*) 135 - 145 mEq/L   Potassium 4.4  3.5 - 5.1 mEq/L   Chloride 104  96 - 112 mEq/L   CO2 20  19 - 32 mEq/L   Glucose, Bld 117 (*) 70 - 99 mg/dL   BUN 29 (*) 6 - 23 mg/dL   Creatinine, Ser 0.96 (*) 0.50 - 1.10 mg/dL   Calcium 8.0 (*) 8.4 - 10.5 mg/dL   GFR calc non Af Amer 39 (*) >90 mL/min   GFR calc Af Amer 45 (*) >90 mL/min  BASIC METABOLIC PANEL      Component Value Range   Sodium 134 (*) 135 - 145 mEq/L   Potassium 4.2  3.5 - 5.1 mEq/L   Chloride 104  96 - 112 mEq/L   CO2 20  19 - 32 mEq/L   Glucose, Bld 99  70 - 99 mg/dL   BUN 37 (*) 6 - 23 mg/dL   Creatinine, Ser 0.45 (*) 0.50 - 1.10 mg/dL   Calcium 7.9 (*) 8.4 - 10.5 mg/dL   GFR calc non Af Amer 26 (*) >90 mL/min   GFR calc Af Amer 30 (*) >90 mL/min  CBC WITH DIFFERENTIAL      Component Value Range   WBC 12.9 (*) 4.0 - 10.5 K/uL   RBC 2.82 (*) 3.87 - 5.11 MIL/uL   Hemoglobin 7.6 (*) 12.0 - 15.0 g/dL   HCT 40.9 (*) 81.1 - 91.4 %   MCV 83.7  78.0 - 100.0 fL   MCH 27.0  26.0 - 34.0 pg   MCHC 32.2  30.0 - 36.0 g/dL   RDW 78.2 (*) 95.6 - 21.3 %   Platelets  153  150 - 400 K/uL   Neutrophils Relative 68  43 - 77 %   Lymphocytes Relative 16  12 - 46 %   Monocytes Relative 14 (*) 3 - 12 %   Eosinophils Relative 2  0 - 5 %   Basophils Relative 0  0 - 1 %   Neutro Abs 8.7 (*) 1.7 - 7.7 K/uL   Lymphs Abs 2.1  0.7 - 4.0 K/uL   Monocytes Absolute 1.8 (*) 0.1 - 1.0 K/uL   Eosinophils Absolute 0.3  0.0 - 0.7 K/uL   Basophils Absolute 0.0  0.0 - 0.1 K/uL   RBC Morphology TEARDROP CELLS    GLUCOSE, CAPILLARY      Component Value Range   Glucose-Capillary 196 (*) 70 - 99 mg/dL   Comment 1 Notify RN     Comment 2 Documented in Chart    PREPARE RBC (CROSSMATCH)      Component Value Range   Order Confirmation  ORDER PROCESSED BY BLOOD BANK    GLUCOSE, CAPILLARY      Component Value Range   Glucose-Capillary 106 (*) 70 - 99 mg/dL  RENAL FUNCTION PANEL      Component Value Range   Sodium 133 (*) 135 - 145 mEq/L   Potassium 4.4  3.5 - 5.1 mEq/L   Chloride 103  96 - 112 mEq/L   CO2 19  19 - 32 mEq/L   Glucose, Bld 68 (*) 70 - 99 mg/dL   BUN 26 (*) 6 - 23 mg/dL   Creatinine, Ser 7.82 (*) 0.50 - 1.10 mg/dL   Calcium 8.1 (*) 8.4 - 10.5 mg/dL   Phosphorus 2.2 (*) 2.3 - 4.6 mg/dL   Albumin 2.3 (*) 3.5 - 5.2 g/dL   GFR calc non Af Amer 45 (*) >90 mL/min   GFR calc Af Amer 52 (*) >90 mL/min  GLUCOSE, CAPILLARY      Component Value Range   Glucose-Capillary 142 (*) 70 - 99 mg/dL   Comment 1 Notify RN     Comment 2 Documented in Chart    GLUCOSE, CAPILLARY      Component Value Range   Glucose-Capillary 117 (*) 70 - 99 mg/dL   Comment 1 Notify RN     Comment 2 Documented in Chart    GLUCOSE, CAPILLARY      Component Value Range   Glucose-Capillary 61 (*) 70 - 99 mg/dL  GLUCOSE, CAPILLARY      Component Value Range   Glucose-Capillary 61 (*) 70 - 99 mg/dL   Comment 1 Notify RN    GLUCOSE, CAPILLARY      Component Value Range   Glucose-Capillary 69 (*) 70 - 99 mg/dL  GLUCOSE, CAPILLARY      Component Value Range   Glucose-Capillary 75  70 - 99 mg/dL  CBC      Component Value Range   WBC 12.3 (*) 4.0 - 10.5 K/uL   RBC 3.55 (*) 3.87 - 5.11 MIL/uL   Hemoglobin 9.8 (*) 12.0 - 15.0 g/dL   HCT 95.6 (*) 21.3 - 08.6 %   MCV 83.9  78.0 - 100.0 fL   MCH 27.6  26.0 - 34.0 pg   MCHC 32.9  30.0 - 36.0 g/dL   RDW 57.8 (*) 46.9 - 62.9 %   Platelets 148 (*) 150 - 400 K/uL  GLUCOSE, CAPILLARY      Component Value Range   Glucose-Capillary 52 (*) 70 - 99 mg/dL  GLUCOSE, CAPILLARY  Component Value Range   Glucose-Capillary 43 (*) 70 - 99 mg/dL  GLUCOSE, CAPILLARY      Component Value Range   Glucose-Capillary 69 (*) 70 - 99 mg/dL  GLUCOSE, CAPILLARY      Component Value Range    Glucose-Capillary 86  70 - 99 mg/dL  GLUCOSE, CAPILLARY      Component Value Range   Glucose-Capillary 196 (*) 70 - 99 mg/dL   Comment 1 Notify RN    RENAL FUNCTION PANEL      Component Value Range   Sodium 131 (*) 135 - 145 mEq/L   Potassium 4.1  3.5 - 5.1 mEq/L   Chloride 102  96 - 112 mEq/L   CO2 23  19 - 32 mEq/L   Glucose, Bld 71  70 - 99 mg/dL   BUN 20  6 - 23 mg/dL   Creatinine, Ser 7.84  0.50 - 1.10 mg/dL   Calcium 8.6  8.4 - 69.6 mg/dL   Phosphorus 1.4 (*) 2.3 - 4.6 mg/dL   Albumin 2.2 (*) 3.5 - 5.2 g/dL   GFR calc non Af Amer 60 (*) >90 mL/min   GFR calc Af Amer 70 (*) >90 mL/min  GLUCOSE, CAPILLARY      Component Value Range   Glucose-Capillary 154 (*) 70 - 99 mg/dL  CBC      Component Value Range   WBC 9.3  4.0 - 10.5 K/uL   RBC 3.50 (*) 3.87 - 5.11 MIL/uL   Hemoglobin 9.5 (*) 12.0 - 15.0 g/dL   HCT 29.5 (*) 28.4 - 13.2 %   MCV 83.7  78.0 - 100.0 fL   MCH 27.1  26.0 - 34.0 pg   MCHC 32.4  30.0 - 36.0 g/dL   RDW 44.0 (*) 10.2 - 72.5 %   Platelets 171  150 - 400 K/uL  GLUCOSE, CAPILLARY      Component Value Range   Glucose-Capillary 141 (*) 70 - 99 mg/dL  GLUCOSE, CAPILLARY      Component Value Range   Glucose-Capillary 63 (*) 70 - 99 mg/dL  GLUCOSE, CAPILLARY      Component Value Range   Glucose-Capillary 65 (*) 70 - 99 mg/dL  GLUCOSE, CAPILLARY      Component Value Range   Glucose-Capillary 85  70 - 99 mg/dL  GLUCOSE, CAPILLARY      Component Value Range   Glucose-Capillary 100 (*) 70 - 99 mg/dL  RENAL FUNCTION PANEL      Component Value Range   Sodium 138  135 - 145 mEq/L   Potassium 4.2  3.5 - 5.1 mEq/L   Chloride 104  96 - 112 mEq/L   CO2 24  19 - 32 mEq/L   Glucose, Bld 57 (*) 70 - 99 mg/dL   BUN 19  6 - 23 mg/dL   Creatinine, Ser 3.66  0.50 - 1.10 mg/dL   Calcium 8.6  8.4 - 44.0 mg/dL   Phosphorus 2.2 (*) 2.3 - 4.6 mg/dL   Albumin 2.1 (*) 3.5 - 5.2 g/dL   GFR calc non Af Amer 54 (*) >90 mL/min   GFR calc Af Amer 63 (*) >90 mL/min    GLUCOSE, CAPILLARY      Component Value Range   Glucose-Capillary 144 (*) 70 - 99 mg/dL  GLUCOSE, CAPILLARY      Component Value Range   Glucose-Capillary 140 (*) 70 - 99 mg/dL   Comment 1 Documented in Chart     Comment 2 Notify RN  GLUCOSE, CAPILLARY      Component Value Range   Glucose-Capillary 98  70 - 99 mg/dL  GLUCOSE, CAPILLARY      Component Value Range   Glucose-Capillary 62 (*) 70 - 99 mg/dL   Comment 1 Documented in Chart     Comment 2 Notify RN    GLUCOSE, CAPILLARY      Component Value Range   Glucose-Capillary 55 (*) 70 - 99 mg/dL   Comment 1 Documented in Chart     Comment 2 Notify RN    GLUCOSE, CAPILLARY      Component Value Range   Glucose-Capillary 55 (*) 70 - 99 mg/dL  GLUCOSE, CAPILLARY      Component Value Range   Glucose-Capillary 123 (*) 70 - 99 mg/dL   Comment 1 Documented in Chart     Comment 2 Notify RN    GLUCOSE, CAPILLARY      Component Value Range   Glucose-Capillary 237 (*) 70 - 99 mg/dL   Comment 1 Documented in Chart     Comment 2 Notify RN    GLUCOSE, CAPILLARY      Component Value Range   Glucose-Capillary 248 (*) 70 - 99 mg/dL   Comment 1 Documented in Chart     Comment 2 Notify RN    GLUCOSE, CAPILLARY      Component Value Range   Glucose-Capillary 150 (*) 70 - 99 mg/dL  GLUCOSE, CAPILLARY      Component Value Range   Glucose-Capillary 46 (*) 70 - 99 mg/dL  GLUCOSE, CAPILLARY      Component Value Range   Glucose-Capillary 56 (*) 70 - 99 mg/dL  GLUCOSE, CAPILLARY      Component Value Range   Glucose-Capillary 76  70 - 99 mg/dL  GLUCOSE, CAPILLARY      Component Value Range   Glucose-Capillary 148 (*) 70 - 99 mg/dL   Comment 1 Notify RN     Comment 2 Documented in Chart    GLUCOSE, CAPILLARY      Component Value Range   Glucose-Capillary 160 (*) 70 - 99 mg/dL   Comment 1 Notify RN     Comment 2 Documented in Chart    GLUCOSE, CAPILLARY      Component Value Range   Glucose-Capillary 118 (*) 70 - 99 mg/dL   Comment  1 Documented in Chart     Comment 2 Notify RN    GLUCOSE, CAPILLARY      Component Value Range   Glucose-Capillary 142 (*) 70 - 99 mg/dL   Comment 1 Documented in Chart     Comment 2 Notify RN    GLUCOSE, CAPILLARY      Component Value Range   Glucose-Capillary 210 (*) 70 - 99 mg/dL  GLUCOSE, CAPILLARY      Component Value Range   Glucose-Capillary 192 (*) 70 - 99 mg/dL   Comment 1 Notify RN    GLUCOSE, CAPILLARY      Component Value Range   Glucose-Capillary 128 (*) 70 - 99 mg/dL   Comment 1 Notify RN    GLUCOSE, CAPILLARY      Component Value Range   Glucose-Capillary 143 (*) 70 - 99 mg/dL   Comment 1 Notify RN      Dg Chest 2 View  05/20/2012  *RADIOLOGY REPORT*  Clinical Data: Preop for lumbar fusion  CHEST - 2 VIEW  Comparison:  Findings: Cardiomediastinal silhouette is unremarkable.  Mild elevation of the right hemidiaphragm.  Post cholecystectomy surgical  clips are noted.  No acute infiltrate or pleural effusion. No pulmonary edema.  Mild degenerative changes thoracic spine.  IMPRESSION:  No active disease.  Mild degenerative changes thoracic spine.   Original Report Authenticated By: Natasha Mead, M.D.    Dg Lumbar Spine 2-3 Views  05/25/2012  *RADIOLOGY REPORT*  Clinical data:  Lumbar fusion  LUMBAR SPINE TWO-VIEW  Comparison: 04/18/2012  Findings:  Two intraoperative lumbar spine fluoroscopic spot radiographs document changes of bilateral pedicle screw placement L2-S1.  Graft markers project in the intervening interspaces.  IMPRESSION: 1.  P L I F L2-S1.   Original Report Authenticated By: Osa Craver, M.D.    Dg Lumbar Spine 1 View  05/25/2012  *RADIOLOGY REPORT*  Clinical Data: L2-S1 PLIF.  LUMBAR SPINE - 1 VIEW  Comparison: 04/19/2012.  Findings: A single intraoperative cross-table lateral view of the lumbar spine, taken at 0950 hours, is submitted.  Surgical instrument tip projects over the L4-5 facet joints.  Endplate degenerative changes are seen in the mid and  lower lumbar spine. Loss of disc space height at L5-S1.  IMPRESSION: Intraoperative localization, as above.   Original Report Authenticated By: Reyes Ivan, M.D.    US Renal  05/26/2012  *RADIOLOGY REPORT*  Clinical Data:  Acute renal failure.  Preexisting chronic kidney disease stage III.  RENAL/URINARY TRACT ULTRASOUND COMPLETE  Comparison:  None.  Findings:  Right Kidney:  Slightly increased in size and normal parenchymal echogenicity.  Normal length of 16.6 cm.  Left Kidney:  Slightly decreased in size with normal parenchymal echogenicity.  No evidence of mass or hydronephrosis.  Bladder:  Bladder was not distended.  IMPRESSION: Slight asymmetry in renal size but normal parenchymal echogenicity and no hydronephrosis.   Original Report Authenticated By: Elsie Stain, M.D.    Dg C-arm Gt 120 Min  05/25/2012  *RADIOLOGY REPORT*  Clinical data:  Lumbar fusion  LUMBAR SPINE TWO-VIEW  Comparison: 04/18/2012  Findings:  Two intraoperative lumbar spine fluoroscopic spot radiographs document changes of bilateral pedicle screw placement L2-S1.  Graft markers project in the intervening interspaces.  IMPRESSION: 1.  P L I F L2-S1.   Original Report Authenticated By: Osa Craver, M.D.     Antibiotics:  Anti-infectives     Start     Dose/Rate Route Frequency Ordered Stop   05/25/12 2000   ceFAZolin (ANCEF) IVPB 1 g/50 mL premix        1 g 100 mL/hr over 30 Minutes Intravenous Every 8 hours 05/25/12 1641 05/30/12 1722   05/25/12 1239   ceFAZolin (ANCEF) 2-3 GM-% IVPB SOLR     Comments: Reece Packer: cabinet override         05/25/12 1239 05/26/12 0044   05/25/12 0906   bacitracin 50,000 Units in sodium chloride irrigation 0.9 % 500 mL irrigation  Status:  Discontinued          As needed 05/25/12 0908 05/25/12 1431   05/25/12 0810   bacitracin 13086 UNITS injection     Comments: FREEZE, PATRICIA: cabinet override         05/25/12 0810 05/25/12 2014   05/25/12 0000   ceFAZolin  (ANCEF) IVPB 2 g/50 mL premix        2 g 100 mL/hr over 30 Minutes Intravenous 60 min pre-op 05/24/12 1537 05/25/12 1235          Discharge Exam: Blood pressure 122/50, pulse 86, temperature 98.8 F (37.1 C), temperature source Oral, resp. rate 16, height  5\' 2"  (1.575 m), weight 106.7 kg (235 lb 3.7 oz), SpO2 95.00%. Neurologic: Grossly normal Incision ok Gait slow with walker  Discharge Medications:   Medication List  As of 06/02/2012  8:00 AM   STOP taking these medications         celecoxib 200 MG capsule      traMADol 50 MG tablet         TAKE these medications         allopurinol 300 MG tablet   Commonly known as: ZYLOPRIM   Take 300 mg by mouth every morning.      aspirin EC 81 MG tablet   Take 81 mg by mouth daily.      atenolol 50 MG tablet   Commonly known as: TENORMIN   Take 50 mg by mouth every morning.      atorvastatin 40 MG tablet   Commonly known as: LIPITOR   Take 40 mg by mouth at bedtime.      CALCIUM 1200 PO   Take 1 tablet by mouth every morning.      cyclobenzaprine 10 MG tablet   Commonly known as: FLEXERIL   Take 1 tablet (10 mg total) by mouth 3 (three) times daily as needed for muscle spasms.      Fish Oil 1000 MG Caps   Take 1 capsule by mouth 3 (three) times daily.      glimepiride 4 MG tablet   Commonly known as: AMARYL   Take 4 mg by mouth 2 (two) times daily.      insulin glargine 100 UNIT/ML injection   Commonly known as: LANTUS   Inject 54 Units into the skin at bedtime.      levothyroxine 125 MCG tablet   Commonly known as: SYNTHROID, LEVOTHROID   Take 125 mcg by mouth daily.      losartan 100 MG tablet   Commonly known as: COZAAR   Take 100 mg by mouth every morning.      metFORMIN 1000 MG tablet   Commonly known as: GLUCOPHAGE   Take 1,000 mg by mouth 2 (two) times daily with a meal.      multivitamin with minerals Tabs   Take 1 tablet by mouth daily.      omeprazole 20 MG capsule   Commonly known as:  PRILOSEC   Take 20 mg by mouth daily.      oxyCODONE-acetaminophen 5-325 MG per tablet   Commonly known as: PERCOCET/ROXICET   Take 1-2 tablets by mouth every 4 (four) hours as needed.      pioglitazone 45 MG tablet   Commonly known as: ACTOS   Take 45 mg by mouth every morning.      potassium chloride SA 20 MEQ tablet   Commonly known as: K-DUR,KLOR-CON   Take 20 mEq by mouth every morning.      rOPINIRole 1 MG tablet   Commonly known as: REQUIP   Take 1 mg by mouth at bedtime.      triamterene-hydrochlorothiazide 75-50 MG per tablet   Commonly known as: MAXZIDE   Take 1 tablet by mouth every morning.      vitamin B-12 1000 MCG tablet   Commonly known as: CYANOCOBALAMIN   Take 1,000 mcg by mouth every morning.      Vitamin D 2000 UNITS tablet   Take 2,000 Units by mouth every morning.            Disposition: SNF   Final Dx: lumbar fusion  Discharge Orders    Future Orders Please Complete By Expires   Diet - low sodium heart healthy      Increase activity slowly      Discharge instructions      Comments:   no bending or twisting   Call MD for:  temperature >100.4      Call MD for:  persistant nausea and vomiting      Call MD for:  severe uncontrolled pain      Call MD for:  redness, tenderness, or signs of infection (pain, swelling, redness, odor or green/yellow discharge around incision site)      Call MD for:  difficulty breathing, headache or visual disturbances         Follow-up Information    Follow up with CRAM,GARY P, MD. Schedule an appointment as soon as possible for a visit in 2 weeks.   Contact information:   [address], [address] [place] [address] [phone]           Signed: Tia Alert 06/02/2012, 8:00 AM

## 2012-06-02 NOTE — Progress Notes (Signed)
Physical Therapy Treatment Patient Details Name: Paige Holland MRN: [id number] DOB: 1941/09/03 Today's Date: 06/02/2012 Time: 7829-5621 PT Time Calculation (min): 19 min  PT Assessment / Plan / Recommendation Comments on Treatment Session  Patient continuing to be motivated and very eager to get up this morning. Patient progressing well. Planning on DCing to SNF today    Follow Up Recommendations  Skilled nursing facility    Barriers to Discharge        Equipment Recommendations  Rolling walker with 5" wheels;3 in 1 bedside comode;Defer to next venue    Recommendations for Other Services    Frequency Min 5X/week   Plan Discharge plan remains appropriate;Frequency remains appropriate    Precautions / Restrictions Precautions Precautions: Back Precaution Comments: Patient able to recall and maintain all precautions Required Braces or Orthoses: Spinal Brace Spinal Brace: Lumbar corset Restrictions Weight Bearing Restrictions: No   Pertinent Vitals/Pain     Mobility  Bed Mobility Rolling Left: 5: Supervision Right Sidelying to Sit: 5: Supervision Transfers Sit to Stand: 4: Min guard;From bed;With upper extremity assist Stand to Sit: With upper extremity assist;To chair/3-in-1;4: Min guard Details for Transfer Assistance: Reminders for safe technique Ambulation/Gait Ambulation/Gait Assistance: 4: Min guard Ambulation Distance (Feet): 90 Feet Assistive device: Rolling walker Gait Pattern: Step-through pattern;Decreased stride length    Exercises     PT Diagnosis:    PT Problem List:   PT Treatment Interventions:     PT Goals Acute Rehab PT Goals PT Goal: Supine/Side to Sit - Progress: Met PT Goal: Sit to Stand - Progress: Met PT Transfer Goal: Bed to Chair/Chair to Bed - Progress: Met PT Goal: Ambulate - Progress: Progressing toward goal  Visit Information  Last PT Received On: 06/02/12 Assistance Needed: +1    Subjective Data      Cognition  Overall  Cognitive Status: Appears within functional limits for tasks assessed/performed Arousal/Alertness: Awake/alert Orientation Level: Appears intact for tasks assessed Behavior During Session: [facility] for tasks performed    Balance     End of Session PT - End of Session Equipment Utilized During Treatment: Gait belt;Back brace Activity Tolerance: Patient tolerated treatment well Patient left: in chair;with call bell/phone within reach Nurse Communication: Mobility status   GP     Fredrich Birks 06/02/2012, 11:59 AM 06/02/2012 Fredrich Birks PTA [phone] pager [phone] office

## 2012-06-02 NOTE — Progress Notes (Signed)
Pt stable, discharge education complete. Report called to [facility] [place]. Pt dc'd to SNF

## 2012-09-06 ENCOUNTER — Other Ambulatory Visit: Payer: Self-pay | Admitting: Neurosurgery

## 2012-09-15 ENCOUNTER — Encounter (HOSPITAL_COMMUNITY): Payer: Self-pay | Admitting: Pharmacy Technician

## 2012-09-20 ENCOUNTER — Encounter (HOSPITAL_COMMUNITY): Payer: Self-pay

## 2012-09-20 ENCOUNTER — Encounter (HOSPITAL_COMMUNITY)
Admission: RE | Admit: 2012-09-20 | Discharge: 2012-09-20 | Disposition: A | Discharge status: Home / Self Care | Payer: Medicare Other | Source: Ambulatory Visit | Attending: Neurosurgery | Admitting: Neurosurgery

## 2012-09-20 HISTORY — DX: Pulmonary hypertension, unspecified: I27.20

## 2012-09-20 HISTORY — DX: Diverticulosis of intestine, part unspecified, without perforation or abscess without bleeding: K57.90

## 2012-09-20 HISTORY — DX: Restless legs syndrome: G25.81

## 2012-09-20 HISTORY — DX: Dorsalgia, unspecified: M54.9

## 2012-09-20 HISTORY — DX: Personal history of other diseases of the musculoskeletal system and connective tissue: Z87.39

## 2012-09-20 HISTORY — DX: Localized edema: R60.0

## 2012-09-20 HISTORY — DX: Endocarditis, valve unspecified: I38

## 2012-09-20 HISTORY — DX: Personal history of urinary (tract) infections: Z87.440

## 2012-09-20 HISTORY — DX: Personal history of urinary calculi: Z87.442

## 2012-09-20 HISTORY — DX: Hypothyroidism, unspecified: E03.9

## 2012-09-20 HISTORY — DX: Polyneuropathy, unspecified: G62.9

## 2012-09-20 HISTORY — DX: Edema, unspecified: R60.9

## 2012-09-20 HISTORY — DX: Personal history of other medical treatment: Z92.89

## 2012-09-20 HISTORY — DX: Other chronic pain: G89.29

## 2012-09-20 HISTORY — DX: Dizziness and giddiness: R42

## 2012-09-20 HISTORY — DX: Unspecified cataract: H26.9

## 2012-09-20 HISTORY — DX: Type 2 diabetes mellitus without complications: E11.9

## 2012-09-20 HISTORY — DX: Hyperlipidemia, unspecified: E78.5

## 2012-09-20 LAB — TYPE AND SCREEN
ABO/RH(D): O POS
Antibody Screen: NEGATIVE

## 2012-09-20 LAB — SURGICAL PCR SCREEN: Staphylococcus aureus: POSITIVE — AB

## 2012-09-20 LAB — BASIC METABOLIC PANEL
BUN: 18 mg/dL (ref 6–23)
Chloride: 97 mEq/L (ref 96–112)
GFR calc Af Amer: 72 mL/min — ABNORMAL LOW (ref >90)
GFR calc non Af Amer: 62 mL/min — ABNORMAL LOW (ref >90)
Potassium: 4.1 mEq/L (ref 3.5–5.1)
Sodium: 136 mEq/L (ref 135–145)

## 2012-09-20 LAB — CBC
HCT: 32.7 % — ABNORMAL LOW (ref 36.0–46.0)
Hemoglobin: 10.4 g/dL — ABNORMAL LOW (ref 12.0–15.0)
MCHC: 31.8 g/dL (ref 30.0–36.0)
WBC: 8.6 10*3/uL (ref 4.0–10.5)

## 2012-09-20 NOTE — Progress Notes (Signed)
Cardiologist is Dr.Munley with [facility] in [place] request report  Echo to request from Dr.Munley-last one 93yrs ago Stress test to be requested from Dr.Munley-done 3-57yrs ago Denies ever having a heart cath  Dr.Douglas Veatrice Kells with [facility]  CXR in epic from 05/10/12 EKG to be requested from Dr.Munley

## 2012-09-20 NOTE — Pre-Procedure Instructions (Signed)
20 Paige Holland  09/20/2012   Your procedure is scheduled on:  Fri, Jan 3 @ 7:30 AM  Report to [facility] at 5:30 AM.  Call this number if you have problems the morning of surgery: [phone]   Remember:   Do not eat food:After Midnight.    Take these medicines the morning of surgery with A SIP OF WATER: Allopurinol(Zyloprim),Atenolol(Tenormin),Levothyroxine(Synthroid),Omeprazole(Prilosec),Pain Pill(if needed),and Tramadol(Ultram)   Do not wear jewelry, make-up or nail polish.  Do not wear lotions, powders, or perfumes. You may wear deodorant.  Do not shave 48 hours prior to surgery.   Do not bring valuables to the hospital.  Contacts, dentures or bridgework may not be worn into surgery.  Leave suitcase in the car. After surgery it may be brought to your room.  For patients admitted to the hospital, checkout time is 11:00 AM the day of discharge.   Patients discharged the day of surgery will not be allowed to drive home.    Special Instructions: Shower using CHG 2 nights before surgery and the night before surgery.  If you shower the day of surgery use CHG.  Use special wash - you have one bottle of CHG for all showers.  You should use approximately 1/3 of the bottle for each shower.   Please read over the following fact sheets that you were given: Pain Booklet, Coughing and Deep Breathing, Blood Transfusion Information, MRSA Information and Surgical Site Infection Prevention

## 2012-09-20 NOTE — Progress Notes (Addendum)
Sleep study done about 28yrs ago-was using a cpap but doesn't use now-[email or web address] in [place]-to request report

## 2012-09-23 ENCOUNTER — Encounter (HOSPITAL_COMMUNITY): Payer: Self-pay | Admitting: Vascular Surgery

## 2012-09-23 NOTE — Consult Note (Signed)
Anesthesia chart review: Patient is a 71 year old female scheduled for T11 review L2 extension of fusion by Dr. Wynetta Emery on 09/30/12.  She is s/p L2-S1 fusion on 05/25/12.  Other history includes obesity, former smoker, postoperative nausea and vomiting, obstructive sleep apnea, hypertension, hypothyroidism, diabetes mellitus type 2 with peripheral neuropathy, GERD, hyperlipidemia, moderate pulmonary hypertension and mild AS/MR by 11/2010 echo, anemia, restless leg syndrome, nephrolithiasis, cataracts, left arm melanoma. She had urinary retention following her previous lumbar fusion.  Her Cardiologist is Dr. Norman Herrlich with [facility]) in [place].  He saw on 05/04/12 her prior to her last surgery for pre-operative clearance and did not recommend any additional pre-operative cardiac testing.  He felt her overall CV risk was low at that time.  (She has not been seen there since her last surgery.)  An echocardiogram on 12/11/2010 [facility]) showed normal LV systolic function, EF 65%. Diastolic function equivocal to possibly reduce. Mild aortic stenosis without insufficiency. Mild mitral insufficiency. Moderate pulmonary hypertension, PA pressure 50 mmHg.  Nuclear stress test on 11/02/07 [facility]) showed no evidence of ischemia. Normal ejection fraction. Attenuation artifact in the anterior segment of the left ventricle: The soft tissues of the breast.  EKG on 05/04/12 [facility]) showed SR, poor r wave progression.   Chest x-ray on 05/20/2012 showed no active disease. Nodular changes of the thoracic spine.  Pre-operative labs noted.  Cr 0.91, H/H 10.4/32.7.  Non-fasting glucose 273.  She will get a CBG on arrival.  T&S done.  She tolerated a four level lumbar fusion just over four months ago, and was cleared by her cardiologist prior to that procedure.  If her glucose is reasonable and no new cardiopulmonary symptoms then would anticipate she could proceed as planned.  Shonna Chock,  PA-C 09/13/12 1304

## 2012-09-29 MED ORDER — CEFAZOLIN SODIUM-DEXTROSE 2-3 GM-% IV SOLR
2.0000 g | INTRAVENOUS | Status: AC
  Administered 2012-09-30: 2 g via INTRAVENOUS
  Filled 2012-09-29: qty 50

## 2012-09-30 ENCOUNTER — Encounter (HOSPITAL_COMMUNITY): Payer: Self-pay | Admitting: *Deleted

## 2012-09-30 ENCOUNTER — Inpatient Hospital Stay (HOSPITAL_COMMUNITY): Payer: Medicare Other

## 2012-09-30 ENCOUNTER — Encounter (HOSPITAL_COMMUNITY): Payer: Self-pay | Admitting: Vascular Surgery

## 2012-09-30 ENCOUNTER — Inpatient Hospital Stay (HOSPITAL_COMMUNITY): Payer: Medicare Other | Admitting: Vascular Surgery

## 2012-09-30 ENCOUNTER — Encounter (HOSPITAL_COMMUNITY)
Admission: RE | Disposition: A | Discharge status: Skilled Nursing Facility | Payer: Self-pay | Source: Ambulatory Visit | Attending: Neurosurgery

## 2012-09-30 ENCOUNTER — Inpatient Hospital Stay (HOSPITAL_COMMUNITY)
Admission: RE | Admit: 2012-09-30 | Discharge: 2012-10-05 | DRG: 457 | Disposition: A | Discharge status: Skilled Nursing Facility | Payer: Medicare Other | Source: Ambulatory Visit | Attending: Neurosurgery | Admitting: Neurosurgery

## 2012-09-30 DIAGNOSIS — Z794 Long term (current) use of insulin: Secondary | ICD-10-CM

## 2012-09-30 DIAGNOSIS — E785 Hyperlipidemia, unspecified: Secondary | ICD-10-CM | POA: Diagnosis present

## 2012-09-30 DIAGNOSIS — M4000 Postural kyphosis, site unspecified: Principal | ICD-10-CM | POA: Diagnosis present

## 2012-09-30 DIAGNOSIS — H269 Unspecified cataract: Secondary | ICD-10-CM | POA: Diagnosis present

## 2012-09-30 DIAGNOSIS — I10 Essential (primary) hypertension: Secondary | ICD-10-CM | POA: Diagnosis present

## 2012-09-30 DIAGNOSIS — Y831 Surgical operation with implant of artificial internal device as the cause of abnormal reaction of the patient, or of later complication, without mention of misadventure at the time of the procedure: Secondary | ICD-10-CM | POA: Diagnosis present

## 2012-09-30 DIAGNOSIS — Y92009 Unspecified place in unspecified non-institutional (private) residence as the place of occurrence of the external cause: Secondary | ICD-10-CM

## 2012-09-30 DIAGNOSIS — G609 Hereditary and idiopathic neuropathy, unspecified: Secondary | ICD-10-CM | POA: Diagnosis present

## 2012-09-30 DIAGNOSIS — M109 Gout, unspecified: Secondary | ICD-10-CM | POA: Diagnosis present

## 2012-09-30 DIAGNOSIS — Z7982 Long term (current) use of aspirin: Secondary | ICD-10-CM

## 2012-09-30 DIAGNOSIS — Z79899 Other long term (current) drug therapy: Secondary | ICD-10-CM

## 2012-09-30 DIAGNOSIS — Z01812 Encounter for preprocedural laboratory examination: Secondary | ICD-10-CM

## 2012-09-30 DIAGNOSIS — E119 Type 2 diabetes mellitus without complications: Secondary | ICD-10-CM | POA: Diagnosis present

## 2012-09-30 DIAGNOSIS — M47817 Spondylosis without myelopathy or radiculopathy, lumbosacral region: Secondary | ICD-10-CM | POA: Diagnosis present

## 2012-09-30 DIAGNOSIS — T84498A Other mechanical complication of other internal orthopedic devices, implants and grafts, initial encounter: Secondary | ICD-10-CM | POA: Diagnosis present

## 2012-09-30 DIAGNOSIS — E039 Hypothyroidism, unspecified: Secondary | ICD-10-CM | POA: Diagnosis present

## 2012-09-30 DIAGNOSIS — G2581 Restless legs syndrome: Secondary | ICD-10-CM | POA: Diagnosis present

## 2012-09-30 DIAGNOSIS — K219 Gastro-esophageal reflux disease without esophagitis: Secondary | ICD-10-CM | POA: Diagnosis present

## 2012-09-30 LAB — GLUCOSE, CAPILLARY
Glucose-Capillary: 145 mg/dL — ABNORMAL HIGH (ref 70–99)
Glucose-Capillary: 178 mg/dL — ABNORMAL HIGH (ref 70–99)

## 2012-09-30 LAB — CBC WITH DIFFERENTIAL/PLATELET
Basophils Absolute: 0 10*3/uL (ref 0.0–0.1)
Eosinophils Absolute: 0 10*3/uL (ref 0.0–0.7)
Lymphocytes Relative: 19 % (ref 12–46)
MCHC: 32.5 g/dL (ref 30.0–36.0)
Neutrophils Relative %: 79 % — ABNORMAL HIGH (ref 43–77)
RDW: 17.9 % — ABNORMAL HIGH (ref 11.5–15.5)
WBC: 8.3 10*3/uL (ref 4.0–10.5)

## 2012-09-30 LAB — BASIC METABOLIC PANEL
BUN: 22 mg/dL (ref 6–23)
Creatinine, Ser: 0.91 mg/dL (ref 0.50–1.10)
GFR calc Af Amer: 72 mL/min — ABNORMAL LOW (ref >90)
GFR calc non Af Amer: 62 mL/min — ABNORMAL LOW (ref >90)
Potassium: 3.6 mEq/L (ref 3.5–5.1)

## 2012-09-30 SURGERY — POSTERIOR LUMBAR FUSION 3 LEVEL
Anesthesia: General | Site: Back | Laterality: Bilateral | Wound class: Clean

## 2012-09-30 MED ORDER — ASPIRIN EC 81 MG PO TBEC
81.0000 mg | DELAYED_RELEASE_TABLET | Freq: Every day | ORAL | Status: DC
  Administered 2012-10-01 – 2012-10-05 (×5): 81 mg via ORAL
  Filled 2012-09-30 (×6): qty 1

## 2012-09-30 MED ORDER — VITAMIN D3 25 MCG (1000 UNIT) PO TABS
2000.0000 [IU] | ORAL_TABLET | Freq: Every day | ORAL | Status: DC
  Administered 2012-10-01 – 2012-10-05 (×5): 2000 [IU] via ORAL
  Filled 2012-09-30 (×6): qty 2

## 2012-09-30 MED ORDER — ACETAMINOPHEN 10 MG/ML IV SOLN
INTRAVENOUS | Status: AC
  Filled 2012-09-30: qty 100

## 2012-09-30 MED ORDER — METFORMIN HCL 500 MG PO TABS
1000.0000 mg | ORAL_TABLET | Freq: Two times a day (BID) | ORAL | Status: DC
  Administered 2012-09-30 – 2012-10-05 (×10): 1000 mg via ORAL
  Filled 2012-09-30 (×12): qty 2

## 2012-09-30 MED ORDER — 0.9 % SODIUM CHLORIDE (POUR BTL) OPTIME
TOPICAL | Status: DC | PRN
  Administered 2012-09-30: 1000 mL

## 2012-09-30 MED ORDER — FUROSEMIDE 40 MG PO TABS
40.0000 mg | ORAL_TABLET | Freq: Every day | ORAL | Status: DC
  Administered 2012-10-01 – 2012-10-05 (×5): 40 mg via ORAL
  Filled 2012-09-30 (×6): qty 1

## 2012-09-30 MED ORDER — ONDANSETRON HCL 4 MG/2ML IJ SOLN
INTRAMUSCULAR | Status: DC | PRN
  Administered 2012-09-30: 4 mg via INTRAVENOUS

## 2012-09-30 MED ORDER — LIDOCAINE-EPINEPHRINE 1 %-1:100000 IJ SOLN
INTRAMUSCULAR | Status: DC | PRN
  Administered 2012-09-30: 10 mL

## 2012-09-30 MED ORDER — ATENOLOL 50 MG PO TABS
50.0000 mg | ORAL_TABLET | Freq: Every day | ORAL | Status: DC
  Administered 2012-10-01 – 2012-10-05 (×5): 50 mg via ORAL
  Filled 2012-09-30 (×5): qty 1

## 2012-09-30 MED ORDER — ARTIFICIAL TEARS OP OINT
TOPICAL_OINTMENT | OPHTHALMIC | Status: DC | PRN
  Administered 2012-09-30: 1 via OPHTHALMIC

## 2012-09-30 MED ORDER — ROCURONIUM BROMIDE 100 MG/10ML IV SOLN
INTRAVENOUS | Status: DC | PRN
  Administered 2012-09-30: 50 mg via INTRAVENOUS

## 2012-09-30 MED ORDER — LOSARTAN POTASSIUM 50 MG PO TABS
100.0000 mg | ORAL_TABLET | Freq: Every day | ORAL | Status: DC
Start: 2012-09-30 — End: 2012-09-30
  Filled 2012-09-30: qty 2

## 2012-09-30 MED ORDER — BUPIVACAINE HCL (PF) 0.25 % IJ SOLN
INTRAMUSCULAR | Status: DC | PRN
  Administered 2012-09-30: 10 mL

## 2012-09-30 MED ORDER — LACTATED RINGERS IV SOLN
INTRAVENOUS | Status: DC | PRN
  Administered 2012-09-30 (×2): via INTRAVENOUS

## 2012-09-30 MED ORDER — PROPOFOL 10 MG/ML IV BOLUS
INTRAVENOUS | Status: DC | PRN
  Administered 2012-09-30: 100 mg via INTRAVENOUS

## 2012-09-30 MED ORDER — ONDANSETRON HCL 4 MG/2ML IJ SOLN: 4.0000 mg | Freq: Once | INTRAMUSCULAR | Status: DC | PRN

## 2012-09-30 MED ORDER — ATORVASTATIN CALCIUM 40 MG PO TABS
40.0000 mg | ORAL_TABLET | Freq: Every day | ORAL | Status: DC
  Administered 2012-09-30 – 2012-10-04 (×5): 40 mg via ORAL
  Filled 2012-09-30 (×6): qty 1

## 2012-09-30 MED ORDER — OXYCODONE-ACETAMINOPHEN 5-325 MG PO TABS
1.0000 | ORAL_TABLET | ORAL | Status: DC | PRN
  Administered 2012-09-30 – 2012-10-01 (×3): 2 via ORAL
  Filled 2012-09-30 (×3): qty 2

## 2012-09-30 MED ORDER — ACETAMINOPHEN 325 MG PO TABS: 650.0000 mg | ORAL_TABLET | ORAL | Status: DC | PRN

## 2012-09-30 MED ORDER — POTASSIUM CHLORIDE IN NACL 20-0.45 MEQ/L-% IV SOLN
INTRAVENOUS | Status: DC
  Administered 2012-09-30 – 2012-10-01 (×2): via INTRAVENOUS
  Filled 2012-09-30 (×6): qty 1000

## 2012-09-30 MED ORDER — PANTOPRAZOLE SODIUM 40 MG PO TBEC
40.0000 mg | DELAYED_RELEASE_TABLET | Freq: Every day | ORAL | Status: DC
  Administered 2012-10-01 – 2012-10-05 (×5): 40 mg via ORAL
  Filled 2012-09-30 (×5): qty 1

## 2012-09-30 MED ORDER — LOSARTAN POTASSIUM 50 MG PO TABS
100.0000 mg | ORAL_TABLET | Freq: Every day | ORAL | Status: DC
  Administered 2012-09-30 – 2012-10-05 (×6): 100 mg via ORAL
  Filled 2012-09-30 (×6): qty 2

## 2012-09-30 MED ORDER — MENTHOL 3 MG MT LOZG: 1.0000 | LOZENGE | OROMUCOSAL | Status: DC | PRN

## 2012-09-30 MED ORDER — PREGABALIN 50 MG PO CAPS
50.0000 mg | ORAL_CAPSULE | Freq: Every day | ORAL | Status: DC
  Administered 2012-09-30 – 2012-10-04 (×5): 50 mg via ORAL
  Filled 2012-09-30 (×5): qty 1

## 2012-09-30 MED ORDER — PHENYLEPHRINE HCL 10 MG/ML IJ SOLN
INTRAMUSCULAR | Status: DC | PRN
  Administered 2012-09-30 (×4): 40 ug via INTRAVENOUS

## 2012-09-30 MED ORDER — PIOGLITAZONE HCL 45 MG PO TABS
45.0000 mg | ORAL_TABLET | Freq: Every day | ORAL | Status: DC
  Administered 2012-10-01 – 2012-10-05 (×5): 45 mg via ORAL
  Filled 2012-09-30 (×6): qty 1

## 2012-09-30 MED ORDER — INSULIN GLARGINE 100 UNIT/ML ~~LOC~~ SOLN
25.0000 [IU] | Freq: Every day | SUBCUTANEOUS | Status: DC
  Administered 2012-10-02 – 2012-10-05 (×4): 25 [IU] via SUBCUTANEOUS

## 2012-09-30 MED ORDER — DEXAMETHASONE SODIUM PHOSPHATE 10 MG/ML IJ SOLN
INTRAMUSCULAR | Status: AC
  Administered 2012-09-30: 10 mg via INTRAVENOUS
  Filled 2012-09-30: qty 1

## 2012-09-30 MED ORDER — TRAMADOL HCL 50 MG PO TABS
50.0000 mg | ORAL_TABLET | Freq: Four times a day (QID) | ORAL | Status: DC | PRN
  Administered 2012-10-03 – 2012-10-05 (×2): 50 mg via ORAL
  Filled 2012-09-30 (×3): qty 1

## 2012-09-30 MED ORDER — OXYCODONE HCL 5 MG PO TABS
5.0000 mg | ORAL_TABLET | Freq: Once | ORAL | Status: AC | PRN
  Administered 2012-09-30: 5 mg via ORAL

## 2012-09-30 MED ORDER — MUPIROCIN 2 % EX OINT
TOPICAL_OINTMENT | Freq: Two times a day (BID) | CUTANEOUS | Status: AC
  Administered 2012-09-30 – 2012-10-04 (×10): via NASAL
  Filled 2012-09-30: qty 22

## 2012-09-30 MED ORDER — ALLOPURINOL 300 MG PO TABS
300.0000 mg | ORAL_TABLET | Freq: Every day | ORAL | Status: DC
  Administered 2012-10-01 – 2012-10-05 (×5): 300 mg via ORAL
  Filled 2012-09-30 (×5): qty 1

## 2012-09-30 MED ORDER — LEVOTHYROXINE SODIUM 125 MCG PO TABS
125.0000 ug | ORAL_TABLET | Freq: Every day | ORAL | Status: DC
  Administered 2012-10-01 – 2012-10-05 (×5): 125 ug via ORAL
  Filled 2012-09-30 (×6): qty 1

## 2012-09-30 MED ORDER — BACITRACIN 50000 UNITS IM SOLR
INTRAMUSCULAR | Status: AC
  Filled 2012-09-30: qty 1

## 2012-09-30 MED ORDER — VITAMIN D 50 MCG (2000 UT) PO TABS: 2000.0000 [IU] | ORAL_TABLET | ORAL | Status: DC

## 2012-09-30 MED ORDER — CYCLOBENZAPRINE HCL 10 MG PO TABS
10.0000 mg | ORAL_TABLET | Freq: Three times a day (TID) | ORAL | Status: DC | PRN
  Administered 2012-09-30 – 2012-10-05 (×4): 10 mg via ORAL
  Filled 2012-09-30 (×4): qty 1

## 2012-09-30 MED ORDER — MUPIROCIN 2 % EX OINT
TOPICAL_OINTMENT | CUTANEOUS | Status: AC
  Filled 2012-09-30: qty 22

## 2012-09-30 MED ORDER — GLIMEPIRIDE 4 MG PO TABS
4.0000 mg | ORAL_TABLET | Freq: Two times a day (BID) | ORAL | Status: DC
  Administered 2012-09-30 – 2012-10-05 (×10): 4 mg via ORAL
  Filled 2012-09-30 (×12): qty 1

## 2012-09-30 MED ORDER — SODIUM CHLORIDE 0.9 % IJ SOLN: 3.0000 mL | INTRAMUSCULAR | Status: DC | PRN

## 2012-09-30 MED ORDER — SODIUM CHLORIDE 0.9 % IV SOLN: 250.0000 mL | INTRAVENOUS | Status: DC

## 2012-09-30 MED ORDER — OXYCODONE HCL 5 MG PO TABS
ORAL_TABLET | ORAL | Status: AC
  Filled 2012-09-30: qty 1

## 2012-09-30 MED ORDER — MIDAZOLAM HCL 5 MG/5ML IJ SOLN
INTRAMUSCULAR | Status: DC | PRN
  Administered 2012-09-30: 2 mg via INTRAVENOUS

## 2012-09-30 MED ORDER — VECURONIUM BROMIDE 10 MG IV SOLR
INTRAVENOUS | Status: DC | PRN
  Administered 2012-09-30 (×2): 1 mg via INTRAVENOUS
  Administered 2012-09-30: 2 mg via INTRAVENOUS

## 2012-09-30 MED ORDER — ADULT MULTIVITAMIN W/MINERALS CH
1.0000 | ORAL_TABLET | Freq: Every day | ORAL | Status: DC
  Administered 2012-10-01 – 2012-10-05 (×5): 1 via ORAL
  Filled 2012-09-30 (×6): qty 1

## 2012-09-30 MED ORDER — THROMBIN 20000 UNITS EX SOLR
CUTANEOUS | Status: DC | PRN
  Administered 2012-09-30: 08:00:00 via TOPICAL

## 2012-09-30 MED ORDER — METFORMIN HCL 500 MG PO TABS
1000.0000 mg | ORAL_TABLET | Freq: Two times a day (BID) | ORAL | Status: DC
  Filled 2012-09-30 (×2): qty 2

## 2012-09-30 MED ORDER — NEOSTIGMINE METHYLSULFATE 1 MG/ML IJ SOLN
INTRAMUSCULAR | Status: DC | PRN
  Administered 2012-09-30: 5 mg via INTRAVENOUS

## 2012-09-30 MED ORDER — GLYCOPYRROLATE 0.2 MG/ML IJ SOLN
INTRAMUSCULAR | Status: DC | PRN
  Administered 2012-09-30: .8 mg via INTRAVENOUS

## 2012-09-30 MED ORDER — MEPERIDINE HCL 25 MG/ML IJ SOLN: 6.2500 mg | INTRAMUSCULAR | Status: DC | PRN

## 2012-09-30 MED ORDER — VITAMIN B-12 1000 MCG PO TABS
1000.0000 ug | ORAL_TABLET | Freq: Every day | ORAL | Status: DC
  Administered 2012-10-01 – 2012-10-05 (×5): 1000 ug via ORAL
  Filled 2012-09-30 (×6): qty 1

## 2012-09-30 MED ORDER — SODIUM CHLORIDE 0.9 % IJ SOLN
3.0000 mL | Freq: Two times a day (BID) | INTRAMUSCULAR | Status: DC
  Administered 2012-09-30 – 2012-10-01 (×2): 3 mL via INTRAVENOUS

## 2012-09-30 MED ORDER — PANTOPRAZOLE SODIUM 40 MG IV SOLR: 40.0000 mg | Freq: Every day | INTRAVENOUS | Status: DC

## 2012-09-30 MED ORDER — ACETAMINOPHEN 650 MG RE SUPP: 650.0000 mg | RECTAL | Status: DC | PRN

## 2012-09-30 MED ORDER — SODIUM CHLORIDE 0.9 % IV SOLN
INTRAVENOUS | Status: AC
  Filled 2012-09-30: qty 500

## 2012-09-30 MED ORDER — LIDOCAINE HCL (CARDIAC) 20 MG/ML IV SOLN
INTRAVENOUS | Status: DC | PRN
  Administered 2012-09-30: 100 mg via INTRAVENOUS

## 2012-09-30 MED ORDER — FENTANYL CITRATE 0.05 MG/ML IJ SOLN
INTRAMUSCULAR | Status: DC | PRN
  Administered 2012-09-30 (×3): 50 ug via INTRAVENOUS
  Administered 2012-09-30: 100 ug via INTRAVENOUS

## 2012-09-30 MED ORDER — ROPINIROLE HCL 1 MG PO TABS
1.0000 mg | ORAL_TABLET | Freq: Every day | ORAL | Status: DC
  Administered 2012-09-30 – 2012-10-04 (×5): 1 mg via ORAL
  Filled 2012-09-30 (×6): qty 1

## 2012-09-30 MED ORDER — HYDROMORPHONE HCL PF 1 MG/ML IJ SOLN
0.2500 mg | INTRAMUSCULAR | Status: DC | PRN
  Administered 2012-09-30: 0.5 mg via INTRAVENOUS

## 2012-09-30 MED ORDER — HYDROMORPHONE HCL PF 1 MG/ML IJ SOLN
INTRAMUSCULAR | Status: AC
  Filled 2012-09-30: qty 1

## 2012-09-30 MED ORDER — ACETAMINOPHEN 10 MG/ML IV SOLN
1000.0000 mg | Freq: Once | INTRAVENOUS | Status: AC
  Administered 2012-09-30: 1000 mg via INTRAVENOUS
  Filled 2012-09-30: qty 100

## 2012-09-30 MED ORDER — PHENOL 1.4 % MT LIQD: 1.0000 | OROMUCOSAL | Status: DC | PRN

## 2012-09-30 MED ORDER — POTASSIUM CHLORIDE CRYS ER 20 MEQ PO TBCR
20.0000 meq | EXTENDED_RELEASE_TABLET | Freq: Every day | ORAL | Status: DC
  Administered 2012-10-01 – 2012-10-05 (×5): 20 meq via ORAL
  Filled 2012-09-30 (×6): qty 1

## 2012-09-30 MED ORDER — ONDANSETRON HCL 4 MG/2ML IJ SOLN: 4.0000 mg | INTRAMUSCULAR | Status: DC | PRN

## 2012-09-30 MED ORDER — SODIUM CHLORIDE 0.9 % IR SOLN
Status: DC | PRN
  Administered 2012-09-30: 08:00:00

## 2012-09-30 MED ORDER — HYDROMORPHONE HCL PF 1 MG/ML IJ SOLN
0.5000 mg | INTRAMUSCULAR | Status: DC | PRN
  Administered 2012-09-30 – 2012-10-01 (×3): 1 mg via INTRAVENOUS
  Filled 2012-09-30 (×4): qty 1

## 2012-09-30 MED ORDER — CEFAZOLIN SODIUM 1-5 GM-% IV SOLN
1.0000 g | Freq: Three times a day (TID) | INTRAVENOUS | Status: AC
  Administered 2012-09-30 (×2): 1 g via INTRAVENOUS
  Filled 2012-09-30 (×2): qty 50

## 2012-09-30 MED ORDER — OXYCODONE HCL 5 MG/5ML PO SOLN: 5.0000 mg | Freq: Once | ORAL | Status: AC | PRN

## 2012-09-30 MED ORDER — DEXAMETHASONE SODIUM PHOSPHATE 10 MG/ML IJ SOLN: 10.0000 mg | INTRAMUSCULAR | Status: DC

## 2012-09-30 MED ORDER — ALBUMIN HUMAN 5 % IV SOLN
INTRAVENOUS | Status: DC | PRN
  Administered 2012-09-30: 10:00:00 via INTRAVENOUS

## 2012-09-30 MED ORDER — CYCLOBENZAPRINE HCL 10 MG PO TABS
ORAL_TABLET | ORAL | Status: AC
  Filled 2012-09-30: qty 1

## 2012-09-30 SURGICAL SUPPLY — 73 items
BAG DECANTER FOR FLEXI CONT (MISCELLANEOUS) ×2 IMPLANT
BENZOIN TINCTURE PRP APPL 2/3 (GAUZE/BANDAGES/DRESSINGS) ×4 IMPLANT
BLADE SURG 11 STRL SS (BLADE) ×2 IMPLANT
BLADE SURG ROTATE 9660 (MISCELLANEOUS) IMPLANT
BRUSH SCRUB EZ PLAIN DRY (MISCELLANEOUS) ×2 IMPLANT
BUR MATCHSTICK NEURO 3.0 LAGG (BURR) ×2 IMPLANT
BUR PRECISION FLUTE 6.0 (BURR) ×2 IMPLANT
CANISTER SUCTION 2500CC (MISCELLANEOUS) ×2 IMPLANT
CAP LOCKING REVERE (Cap) ×12 IMPLANT
CLOTH BEACON ORANGE TIMEOUT ST (SAFETY) ×2 IMPLANT
CONNECTOR INLINE 5.5-5.5 (Connector) ×4 IMPLANT
CONT SPEC 4OZ CLIKSEAL STRL BL (MISCELLANEOUS) ×4 IMPLANT
COVER BACK TABLE 24X17X13 BIG (DRAPES) IMPLANT
COVER TABLE BACK 60X90 (DRAPES) ×2 IMPLANT
DECANTER SPIKE VIAL GLASS SM (MISCELLANEOUS) ×2 IMPLANT
DERMABOND ADVANCED (GAUZE/BANDAGES/DRESSINGS) ×1
DERMABOND ADVANCED .7 DNX12 (GAUZE/BANDAGES/DRESSINGS) ×1 IMPLANT
DRAPE C-ARM 42X72 X-RAY (DRAPES) ×4 IMPLANT
DRAPE LAPAROTOMY 100X72X124 (DRAPES) ×2 IMPLANT
DRAPE POUCH INSTRU U-SHP 10X18 (DRAPES) ×2 IMPLANT
DRAPE PROXIMA HALF (DRAPES) IMPLANT
DRAPE SURG 17X23 STRL (DRAPES) ×2 IMPLANT
DRSG OPSITE 4X5.5 SM (GAUZE/BANDAGES/DRESSINGS) ×2 IMPLANT
ELECT REM PT RETURN 9FT ADLT (ELECTROSURGICAL) ×2
ELECTRODE REM PT RTRN 9FT ADLT (ELECTROSURGICAL) ×1 IMPLANT
EVACUATOR 3/16  PVC DRAIN (DRAIN) ×1
EVACUATOR 3/16 PVC DRAIN (DRAIN) ×1 IMPLANT
GAUZE SPONGE 4X4 16PLY XRAY LF (GAUZE/BANDAGES/DRESSINGS) ×2 IMPLANT
GLOVE BIO SURGEON STRL SZ8 (GLOVE) ×4 IMPLANT
GLOVE BIO SURGEON STRL SZ8.5 (GLOVE) ×2 IMPLANT
GLOVE BIOGEL PI IND STRL 7.0 (GLOVE) ×1 IMPLANT
GLOVE BIOGEL PI INDICATOR 7.0 (GLOVE) ×1
GLOVE EXAM NITRILE LRG STRL (GLOVE) IMPLANT
GLOVE EXAM NITRILE MD LF STRL (GLOVE) IMPLANT
GLOVE EXAM NITRILE XL STR (GLOVE) IMPLANT
GLOVE EXAM NITRILE XS STR PU (GLOVE) IMPLANT
GLOVE INDICATOR 8.5 STRL (GLOVE) ×4 IMPLANT
GLOVE SS BIOGEL STRL SZ 6.5 (GLOVE) ×4 IMPLANT
GLOVE SS BIOGEL STRL SZ 8 (GLOVE) ×1 IMPLANT
GLOVE SUPERSENSE BIOGEL SZ 6.5 (GLOVE) ×4
GLOVE SUPERSENSE BIOGEL SZ 8 (GLOVE) ×1
GOWN BRE IMP SLV AUR LG STRL (GOWN DISPOSABLE) ×2 IMPLANT
GOWN BRE IMP SLV AUR XL STRL (GOWN DISPOSABLE) ×6 IMPLANT
GOWN STRL REIN 2XL LVL4 (GOWN DISPOSABLE) IMPLANT
KIT BASIN OR (CUSTOM PROCEDURE TRAY) ×2 IMPLANT
KIT ROOM TURNOVER OR (KITS) ×2 IMPLANT
MARKER SKIN DUAL TIP RULER LAB (MISCELLANEOUS) ×2 IMPLANT
MIX DBX 10CC 35% BONE (Bone Implant) ×2 IMPLANT
NEEDLE BIOPSY T-LOK 11X4 (NEEDLE) ×2 IMPLANT
NEEDLE HYPO 25X1 1.5 SAFETY (NEEDLE) ×2 IMPLANT
NS IRRIG 1000ML POUR BTL (IV SOLUTION) ×2 IMPLANT
PACK LAMINECTOMY NEURO (CUSTOM PROCEDURE TRAY) ×2 IMPLANT
PAD ARMBOARD 7.5X6 YLW CONV (MISCELLANEOUS) ×6 IMPLANT
RASP 3.0MM (RASP) ×2 IMPLANT
ROD (Rod) ×2 IMPLANT
SCREW PEDICLE 5.5MMX35MM (Screw) ×6 IMPLANT
SCREW PEDICLE 5.5MMX40MM (Screw) ×6 IMPLANT
SPONGE GAUZE 4X4 12PLY (GAUZE/BANDAGES/DRESSINGS) ×2 IMPLANT
SPONGE LAP 4X18 X RAY DECT (DISPOSABLE) IMPLANT
SPONGE SURGIFOAM ABS GEL 100 (HEMOSTASIS) ×2 IMPLANT
STRIP CLOSURE SKIN 1/2X4 (GAUZE/BANDAGES/DRESSINGS) ×2 IMPLANT
STRIP VITOSS 25X100X4MM (Neuro Prosthesis/Implant) ×4 IMPLANT
SUT VIC AB 0 CT1 18XCR BRD8 (SUTURE) ×2 IMPLANT
SUT VIC AB 0 CT1 8-18 (SUTURE) ×2
SUT VIC AB 2-0 CT1 18 (SUTURE) ×4 IMPLANT
SUT VICRYL 4-0 PS2 18IN ABS (SUTURE) ×2 IMPLANT
SYR 20ML ECCENTRIC (SYRINGE) ×2 IMPLANT
SYR CONTROL 10ML LL (SYRINGE) ×2 IMPLANT
T CONNECTOR ADJ 38MM-51MM (Connector) ×2 IMPLANT
TOWEL OR 17X24 6PK STRL BLUE (TOWEL DISPOSABLE) ×2 IMPLANT
TOWEL OR 17X26 10 PK STRL BLUE (TOWEL DISPOSABLE) ×2 IMPLANT
TRAY FOLEY CATH 14FRSI W/METER (CATHETERS) ×2 IMPLANT
WATER STERILE IRR 1000ML POUR (IV SOLUTION) ×2 IMPLANT

## 2012-09-30 NOTE — Anesthesia Postprocedure Evaluation (Signed)
Anesthesia Post Note  Patient: Paige Holland  Procedure(s) Performed: Procedure(s) (LRB): POSTERIOR LUMBAR FUSION 3 LEVEL (Bilateral)  Anesthesia type: general  Patient location: PACU  Post pain: Pain level controlled  Post assessment: Patient's Cardiovascular Status Stable  Last Vitals:  Filed Vitals:   09/30/12 1145  BP:   Pulse: 69  Temp:   Resp: 13    Post vital signs: Reviewed and stable  Level of consciousness: sedated  Complications: No apparent anesthesia complications

## 2012-09-30 NOTE — Anesthesia Preprocedure Evaluation (Addendum)
Anesthesia Evaluation  Patient identified by MRN, date of birth, ID band Patient awake    Reviewed: Allergy & Precautions, H&P , NPO status , Patient's Chart, lab work & pertinent test results  History of Anesthesia Complications (+) PONV  Airway Mallampati: I TM Distance: >3 FB Neck ROM: Full    Dental  (+) Teeth Intact and Dental Advisory Given   Pulmonary sleep apnea ,          Cardiovascular hypertension, Pt. on medications + Valvular Problems/Murmurs     Neuro/Psych    GI/Hepatic GERD-  Medicated and Controlled,  Endo/Other  diabetes, Type 2, Insulin Dependent and Oral Hypoglycemic Agents  Renal/GU      Musculoskeletal   Abdominal   Peds  Hematology   Anesthesia Other Findings   Reproductive/Obstetrics                          Anesthesia Physical Anesthesia Plan  ASA: II  Anesthesia Plan: General   Post-op Pain Management:    Induction: Intravenous  Airway Management Planned: Oral ETT  Additional Equipment:   Intra-op Plan:   Post-operative Plan: Extubation in OR  Informed Consent: I have reviewed the patients History and Physical, chart, labs and discussed the procedure including the risks, benefits and alternatives for the proposed anesthesia with the patient or authorized representative who has indicated his/her understanding and acceptance.     Plan Discussed with: CRNA and Surgeon  Anesthesia Plan Comments:         Anesthesia Quick Evaluation

## 2012-09-30 NOTE — OR Nursing (Signed)
Care assumed

## 2012-09-30 NOTE — Transfer of Care (Signed)
Immediate Anesthesia Transfer of Care Note  Patient: Paige Holland  Procedure(s) Performed: Procedure(s) (LRB) with comments: POSTERIOR LUMBAR FUSION 3 LEVEL (Bilateral) - Thoracic eleven to thoracic three extension of fusion    Patient Location: PACU  Anesthesia Type:General  Level of Consciousness: awake and alert   Airway & Oxygen Therapy: Patient Spontanous Breathing and Patient connected to face mask oxygen  Post-op Assessment: Report given to PACU RN, Post -op Vital signs reviewed and stable and Patient moving all extremities  Post vital signs: Reviewed and stable  Complications: No apparent anesthesia complications

## 2012-09-30 NOTE — H&P (Signed)
Paige Holland is an 72 y.o. female.   Chief Complaint: Back pain HPI: Patient is a 72 year old female who had previously undergone L2-S1 fusion initially did very well however on followup and serial x-rays start to see some kyphosis at the level of directly above her fusion followup CT scan MRI scan showed and endplate fracture of L2 with migration of the tips of the screws into the disc space that level due to progression of kyphosis and instability above her fusion I recommended revision of fusion and extension her fusion up to at least T11 and possibly T10. This will require removal of the L2 screws and plan on using in addition set to tie into the old hardware. 5 extensor reviewed this operation with her as well as perioperative course and expectations of outcome alternatives of surgery she understands and agrees to proceed forward.  Past Medical History  Diagnosis Date  . Sleep apnea     does not use cpap but has one  . Cancer     melanoma lft arm  . Anemia     hx  . Hypertension     takes Furosemide,Atenolol,and Losartan daily  . Hyperlipidemia     takes Atorvastatin daily  . Heart murmur   . Peripheral neuropathy     takes Lyrica daily  . Pulmonary hypertension     mod pulm HTN, PA pressure 50 mm Hg 12/11/10 ([facility])  . Peripheral neuropathy   . Peripheral edema   . Leaky heart valve     mild AS, mild MR, mod pulm HTN, EF 65% 12/11/10 ([facility])  . Restless leg   . Dizziness   . Chronic back pain   . History of kidney stones   . History of gout     takes Allopurinol daily  . GERD (gastroesophageal reflux disease)     takes Omeprazole daily  . Diarrhea   . Diverticulosis   . History of bladder infections   . PONV (postoperative nausea and vomiting)     pt states went into urinary failure after last surgery  . History of blood transfusion   . Hypothyroidism     takes Synthroid daily  . Diabetes mellitus without complication     takes Lantus daily;takes  Metformin and Glimepiride daily as instructed and Actos  . Cataracts, bilateral     immature    Past Surgical History  Procedure Date  . Cholecystectomy 81  . Appendectomy   . Dilation and curettage of uterus 90  . Carpal tunnel release 98    rt  . Trigeminal nerve decompression 2000    gamma knife  . Back surgery 2013  . Colonoscopy     History reviewed. No pertinent family history. Social History:  reports that she quit smoking about 46 years ago. Her smoking use included Cigarettes. She has a 1.5 pack-year smoking history. She does not have any smokeless tobacco history on file. She reports that she does not drink alcohol or use illicit drugs.  Allergies:  Allergies  Allergen Reactions  . Codeine Other (See Comments)    Stomach spasms  . Talwin (Pentazocine) Nausea And Vomiting  . Tape Other (See Comments)    "if adhesive left on for any length of time area turns red, bumps"    Medications Prior to Admission  Medication Sig Dispense Refill  . allopurinol (ZYLOPRIM) 300 MG tablet Take 300 mg by mouth every morning.      Marland Kitchen aspirin EC 81 MG tablet Take 81 mg  by mouth daily.      Marland Kitchen atenolol (TENORMIN) 50 MG tablet Take 50 mg by mouth every morning.      Marland Kitchen atorvastatin (LIPITOR) 40 MG tablet Take 40 mg by mouth at bedtime.      . Calcium Carbonate-Vit D-Min (CALCIUM 1200 PO) Take 1 tablet by mouth every morning.      . Cholecalciferol (VITAMIN D) 2000 UNITS tablet Take 2,000 Units by mouth every morning.      . furosemide (LASIX) 40 MG tablet Take 40 mg by mouth daily.      Marland Kitchen glimepiride (AMARYL) 4 MG tablet Take 4 mg by mouth 2 (two) times daily.      . insulin glargine (LANTUS) 100 UNIT/ML injection Inject 25 Units into the skin at bedtime.       Marland Kitchen levothyroxine (SYNTHROID, LEVOTHROID) 125 MCG tablet Take 125 mcg by mouth daily.      Marland Kitchen losartan (COZAAR) 100 MG tablet Take 100 mg by mouth every morning.      . metFORMIN (GLUCOPHAGE) 1000 MG tablet Take 1,000 mg by mouth 2  (two) times daily with a meal.      . Multiple Vitamin (MULTIVITAMIN WITH MINERALS) TABS Take 1 tablet by mouth daily.      . Omega-3 Fatty Acids (FISH OIL) 1000 MG CAPS Take 1 capsule by mouth 3 (three) times daily.      Marland Kitchen omeprazole (PRILOSEC) 20 MG capsule Take 20 mg by mouth daily.      Marland Kitchen oxyCODONE-acetaminophen (PERCOCET/ROXICET) 5-325 MG per tablet Take 1-2 tablets by mouth every 4 (four) hours as needed. For pain      . pioglitazone (ACTOS) 45 MG tablet Take 45 mg by mouth every morning.      . potassium chloride SA (K-DUR,KLOR-CON) 20 MEQ tablet Take 20 mEq by mouth every morning.      . pregabalin (LYRICA) 50 MG capsule Take 50 mg by mouth at bedtime.      Marland Kitchen rOPINIRole (REQUIP) 1 MG tablet Take 1 mg by mouth at bedtime.      . traMADol (ULTRAM) 50 MG tablet Take 50 mg by mouth every 6 (six) hours as needed. For pain      . vitamin B-12 (CYANOCOBALAMIN) 1000 MCG tablet Take 1,000 mcg by mouth every morning.        Results for orders placed during the hospital encounter of 09/30/12 (from the past 48 hour(s))  GLUCOSE, CAPILLARY     Status: Abnormal   Collection Time   09/30/12  6:12 AM      Component Value Range Comment   Glucose-Capillary 145 (*) 70 - 99 mg/dL    No results found.  Review of Systems  Constitutional: Negative.   HENT: Negative.   Eyes: Negative.   Respiratory: Negative.   Cardiovascular: Negative.   Gastrointestinal: Negative.   Genitourinary: Negative.   Musculoskeletal: Positive for myalgias, back pain and joint pain.  Skin: Negative.   Neurological: Positive for tingling and focal weakness.  Endo/Heme/Allergies: Negative.   Psychiatric/Behavioral: Negative.     Blood pressure 146/54, pulse 68, temperature 98.1 F (36.7 C), temperature source Oral, resp. rate 18, SpO2 98.00%. Physical Exam  Constitutional: She is oriented to person, place, and time. She appears well-developed and well-nourished.  HENT:  Head: Normocephalic.  Eyes: Pupils are equal,  round, and reactive to light.  Neck: Normal range of motion.  Cardiovascular: Normal rate and regular rhythm.   Respiratory: Effort normal.  GI: Soft.  Neurological: She  is alert and oriented to person, place, and time. She displays a negative Romberg sign. GCS eye subscore is 4. GCS verbal subscore is 5. GCS motor subscore is 6.  Reflex Scores:      Patellar reflexes are 0 on the right side and 0 on the left side.      Achilles reflexes are 0 on the right side and 0 on the left side.      Patient strength in her left lower cavity is 5 out of 5 in her iliopsoas, quads, and she's, gastrocs, anterior tibialis, EHL. And her right lower extremity she has focal weakness in dorsiflexion and for the placenta 5 in EHL at 3/5     Assessment/Plan 72 year old female presents for revision of posterior lumbar fusion and extension of the T11  Bryon Parker P 09/30/2012, 7:18 AM

## 2012-09-30 NOTE — Op Note (Signed)
Preoperative diagnosis: L1 to instability at L 2 compression deformity and loosening of L2 screws with migration into the L1-2 disc space and kyphosis at L1 2  Postoperative diagnosis: Same  Procedure: Exploration of fusion removal of hardware L2 T11-L3 pedicle screw fixation using a 5.5 Revere pedicle screw system and the globus addition set posterior lateral arthrodesis T11-L3 using some local autograft with the costs soaked in bone marrow aspirate as well as and DBX mix open reduction spinal deformity L1 2  Surgeon: Jillyn Hidden Anchor Dwan  Assistant: Tressie Stalker  Anesthesia: Gen.  EBL: Minimal  History of present illness: Patient is very pleasant 72 year old female who presented on L2-S1 fusion however patient developed progressive worsening back pain followup imaging with serial x-rays CT scan MRI scan showed kyphotic deformity and compressive compression deformity of the L2 vertebra with kyphosis of the L1-2 disc space and migration of the L2 screws into the L1-2 disc space. Because of the progressive deformity in her progressed clinical syndrome I recommended extension of her fusion with removal of the loose L2 screws reduction of her spinal deformity and fusion carried up T11 I extensively the risk benefits of the operation with her as well as perioperative Course expectations about alternatives of surgery she understood and agreed to proceed forward.  Operative procedure: Patient brought into the or was induced under general anesthesia positioned prone the Wilson frame her back was prepped and draped in routine sterile fashion her old incision the super aspect of it was opened up and extended cephalad subperiosteal dissections care lamina of T11-T12 L1-L2 and exposed down to the screws of L3. TPS and lateral facet complexes at all levels were exposed and interoperative X. identify the appropriate level. Attention was turned first taken to pedicle screw fixation using a combination of AP and lateral  fluoroscopy pilot holes were drilled in the lateral edge of the medial part of the pedicle cannulated with the awl probed [id number] Probed again and 5 5 x 40 screws were inserted at T11 and 5.5 x 35 screws were inserted at T12 and L1 after all the screws in place AP lateral fluoroscopy confirmed good position of screws. Attention second disconnection of the old hardware so the rod was cut without titanium cutting drill bit just inferior to the L2 pedicle screw then the L2 screws removed and it was loose there was a solid fusion on posterior laterally at L2-3 the rod residual rod was dissected free and freed up and then using the end and connectors on the globus addition the connector site these were split in place the nuts were used to hold that. Then the rods were cut and fashioned and bent to fit into the connector and overlie the new pedicle screws at T11-T12 and L1. Reduction of her deformity was achieved with positioning in the OR and removal of the L2 screws so the rods were clamped down and tightened in situ the with no additional compression then aggressive decortication was care MTPs or lateral gutters lateral facet complexes from T11 down to L3 tying into the posterior lateral fusion construct. The.soaked in bone marrow aspirate which was aspirated from the L2 pedicle was then saturated BMP sponges these were then cut in half longitudinally and overlaid from T12 down to L3 with augmentation device and DBX mix up to T11. A cross-link was applied and a large Hemovac drain was placed posterior fluoroscopy confirmed good position of all the implants screws and rods. The wound was then closed with interrupted Vicryl and  running 4 subcuticular in the skin benzoin and Steri-Strips were applied patient recovered in stable condition. At the end of case on it counts sponge counts were correct.

## 2012-10-01 ENCOUNTER — Encounter (HOSPITAL_COMMUNITY): Payer: Self-pay | Admitting: *Deleted

## 2012-10-01 LAB — GLUCOSE, CAPILLARY
Glucose-Capillary: 118 mg/dL — ABNORMAL HIGH (ref 70–99)
Glucose-Capillary: 203 mg/dL — ABNORMAL HIGH (ref 70–99)
Glucose-Capillary: 119 mg/dL — ABNORMAL HIGH (ref 70–99)

## 2012-10-01 MED ORDER — HYDROMORPHONE HCL 2 MG PO TABS
4.0000 mg | ORAL_TABLET | ORAL | Status: DC | PRN
  Administered 2012-10-01 – 2012-10-05 (×13): 4 mg via ORAL
  Filled 2012-10-01 (×13): qty 2

## 2012-10-01 MED ORDER — SODIUM CHLORIDE 0.45 % IV BOLUS: 500.0000 mL | Freq: Once | INTRAVENOUS | Status: DC

## 2012-10-01 MED ORDER — INSULIN GLARGINE 100 UNIT/ML ~~LOC~~ SOLN
25.0000 [IU] | Freq: Once | SUBCUTANEOUS | Status: AC
  Administered 2012-10-01: 25 [IU] via SUBCUTANEOUS

## 2012-10-01 MED ORDER — SODIUM CHLORIDE 0.45 % IV BOLUS
500.0000 mL | Freq: Once | INTRAVENOUS | Status: AC
  Administered 2012-10-01: 500 mL via INTRAVENOUS

## 2012-10-01 MED ORDER — INSULIN ASPART 100 UNIT/ML ~~LOC~~ SOLN
0.0000 [IU] | SUBCUTANEOUS | Status: DC
  Administered 2012-10-01 (×2): 4 [IU] via SUBCUTANEOUS
  Administered 2012-10-01: 7 [IU] via SUBCUTANEOUS
  Administered 2012-10-01: 11 [IU] via SUBCUTANEOUS
  Administered 2012-10-02: 7 [IU] via SUBCUTANEOUS
  Administered 2012-10-02 – 2012-10-03 (×4): 3 [IU] via SUBCUTANEOUS
  Administered 2012-10-03: 7 [IU] via SUBCUTANEOUS
  Administered 2012-10-04: 3 [IU] via SUBCUTANEOUS
  Administered 2012-10-04: 4 [IU] via SUBCUTANEOUS

## 2012-10-01 NOTE — Progress Notes (Signed)
Subjective: Patient reports She has a fair amount back pain was well-controlled on the Dilaudid she is having no leg pain overall she looks good  Objective: Vital signs in last 24 hours: Temp:  [97.4 F (36.3 C)-98.1 F (36.7 C)] 97.8 F (36.6 C) (01/04 0400) Pulse Rate:  [59-84] 60  (01/04 0700) Resp:  [10-20] 12  (01/04 0700) BP: (98-143)/(39-89) 105/39 mmHg (01/04 0700) SpO2:  [96 %-100 %] 99 % (01/04 0700) Weight:  [102.9 kg (226 lb 13.7 oz)] 102.9 kg (226 lb 13.7 oz) (01/03 1300)  Intake/Output from previous day: 01/03 0701 - 01/04 0700 In: 3971.8 [P.O.:670; I.V.:2931.8; IV Piggyback:350] Out: 2040 [Urine:1595; Drains:345; Blood:100] Intake/Output this shift:    Awake alert oriented strength 5 out of 5 in her left looks to me a right-sided has baseline dorsiflexion weakness of her right foot and this is no worse.  Lab Results:  Basename 09/30/12 1330  WBC 8.3  HGB 9.8*  HCT 30.2*  PLT 240   BMET  Basename 09/30/12 1330  NA 136  K 3.6  CL 97  CO2 23  GLUCOSE 240*  BUN 22  CREATININE 0.91  CALCIUM 9.0    Studies/Results: Dg Lumbar Spine 2-3 Views  09/30/2012  *RADIOLOGY REPORT*  Clinical Data: T11-L3 extension of fusion  DG C-ARM 61-120 MIN,LUMBAR SPINE - 2-3 VIEW  Comparison:  Lumbar spine CT - 09/01/2012; lumbar spine radiographs - 07/12/2012  Findings:  Two intraoperative fluoroscopic images of the lower thoracic / upper lumbar spine are provided for review.  Vertebral body labeling is based on preprocedural lumbar spine radiographs and lumbar spine CT and the preexisting lumbar spine hardware.  There is been apparent removal of the previously noted bilateral pedicular screws at L2.  Interval cranial extension of previously noted paraspinal fusion hardware with bilateral pedicular screws now seen at T11, T12 and L1.  Crossbar fixation is seen posterior to the L1 - L2 intervertebral disc space.  Stable sequela of L2 - L3 and L3 - L4 intervertebral disc space  replacement.  The inferior aspect of the paraspinal fusion hardware was not imaged.  IMPRESSION: 1.  Interval cranial extension of paraspinal fusion hardware as above. 2.  Interval removal of bilateral L2 pedicular screws.   Original Report Authenticated By: Tacey Ruiz, MD    Dg C-arm [phone] Min  09/30/2012  *RADIOLOGY REPORT*  Clinical Data: T11-L3 extension of fusion  DG C-ARM 61-120 MIN,LUMBAR SPINE - 2-3 VIEW  Comparison:  Lumbar spine CT - 09/01/2012; lumbar spine radiographs - 07/12/2012  Findings:  Two intraoperative fluoroscopic images of the lower thoracic / upper lumbar spine are provided for review.  Vertebral body labeling is based on preprocedural lumbar spine radiographs and lumbar spine CT and the preexisting lumbar spine hardware.  There is been apparent removal of the previously noted bilateral pedicular screws at L2.  Interval cranial extension of previously noted paraspinal fusion hardware with bilateral pedicular screws now seen at T11, T12 and L1.  Crossbar fixation is seen posterior to the L1 - L2 intervertebral disc space.  Stable sequela of L2 - L3 and L3 - L4 intervertebral disc space replacement.  The inferior aspect of the paraspinal fusion hardware was not imaged.  IMPRESSION: 1.  Interval cranial extension of paraspinal fusion hardware as above. 2.  Interval removal of bilateral L2 pedicular screws.   Original Report Authenticated By: Tacey Ruiz, MD     Assessment/Plan: Continue mobilization physical outpatient therapy transfer to the floor we'll change her  pain medication to Dilaudid by mouth  LOS: 1 day     Choua Ikner P 10/01/2012, 8:33 AM

## 2012-10-01 NOTE — Evaluation (Signed)
Occupational Therapy Evaluation Patient Details Name: Paige Holland MRN: [id number] DOB: 02-Sep-1941 Today's Date: 10/01/2012 Time: 4098-1191 OT Time Calculation (min): 20 min  OT Assessment / Plan / Recommendation Clinical Impression  Pt s/p posterior lumbar fusion thus affecting PLOF.  Will benefit from acute OT services to address below problem list. Recommending ST SNF due to pt does not have 24/7 supervision/assist. However, pt may be able to return with intermittent supervision pending progress.    OT Assessment  Patient needs continued OT Services    Follow Up Recommendations  SNF    Barriers to Discharge Decreased caregiver support Pt does not have 24/7 assist.  Equipment Recommendations  None recommended by OT    Recommendations for Other Services    Frequency  Min 2X/week    Precautions / Restrictions Precautions Precautions: Back Precaution Comments: Pt able to independently recall back precautions from last sx. Reviwed back precautions once again with pt. Required Braces or Orthoses: Spinal Brace Spinal Brace: Lumbar corset;Applied in sitting position   Pertinent Vitals/Pain See vitals    ADL  Eating/Feeding: Performed;Independent Where Assessed - Eating/Feeding: Chair Upper Body Dressing: Performed;Set up Where Assessed - Upper Body Dressing: Supported standing Lower Body Dressing: Simulated;Moderate assistance Where Assessed - Lower Body Dressing: Supported sit to stand Toilet Transfer: Mining engineer Method: Sit to Barista:  (chair) Equipment Used: Back brace;Gait belt;Rolling walker Transfers/Ambulation Related to ADLs: min guard with RW during ambulation. Min verbal cueing for technique using RW. ADL Comments: Pt owns a toilet aid which she has brought to hospital with her.      OT Diagnosis: Acute pain;Generalized weakness  OT Problem List: Decreased strength;Decreased activity tolerance;Decreased  knowledge of use of DME or AE;Decreased knowledge of precautions;Pain OT Treatment Interventions: Self-care/ADL training;DME and/or AE instruction;Therapeutic activities;Patient/family education   OT Goals Acute Rehab OT Goals OT Goal Formulation: With patient Time For Goal Achievement: 10/08/12 Potential to Achieve Goals: Good ADL Goals Pt Will Perform Grooming: with modified independence;Standing at sink ADL Goal: Grooming - Progress: Goal set today Pt Will Perform Lower Body Dressing: with modified independence;Sit to stand from chair;Sit to stand from bed;with adaptive equipment ADL Goal: Lower Body Dressing - Progress: Goal set today Pt Will Transfer to Toilet: with modified independence;Ambulation;with DME;Comfort height toilet;Maintaining back safety precautions ADL Goal: Toilet Transfer - Progress: Goal set today Pt Will Perform Toileting - Clothing Manipulation: with modified independence;Sitting on 3-in-1 or toilet;Standing ADL Goal: Toileting - Clothing Manipulation - Progress: Goal set today Pt Will Perform Toileting - Hygiene: with modified independence;Sit to stand from 3-in-1/toilet;Standing at 3-in-1/toilet;with adaptive equipment ADL Goal: Toileting - Hygiene - Progress: Goal set today Pt Will Perform Tub/Shower Transfer: Tub transfer;Ambulation;with DME;Transfer tub bench;Maintaining back safety precautions ADL Goal: Tub/Shower Transfer - Progress: Goal set today Miscellaneous OT Goals Miscellaneous OT Goal #1: Pt will perform bed mobility at mod I level with HOB flat and use of bed rail as precursor for EOB ADLs. OT Goal: Miscellaneous Goal #1 - Progress: Goal set today  Visit Information  Last OT Received On: 10/01/12 Assistance Needed: +1 PT/OT Co-Evaluation/Treatment: Yes    Subjective Data  Subjective: "I did better than I thought I would"   Prior Functioning     Home Living Lives With: Son Available Help at Discharge: Family;Available  PRN/intermittently Type of Home: House Home Access: Stairs to enter [employer] of Steps: 3 Entrance Stairs-Rails: Right Home Layout: One level Bathroom Shower/Tub: Engineer, manufacturing systems: Standard Home Adaptive Equipment:  Bedside commode/3-in-1;Shower chair with back;Tub transfer bench;Walker - rolling;Walker - standard;Long-handled shoehorn;Reacher;Sock aid (rollator; toilet aid; bed rail) Prior Function Level of Independence: Independent with assistive device(s) Able to Take Stairs?: Yes Communication Communication: No difficulties Dominant Hand: Right         Vision/Perception     Cognition  Overall Cognitive Status: Appears within functional limits for tasks assessed/performed Arousal/Alertness: Awake/alert Orientation Level: Appears intact for tasks assessed Behavior During Session: [facility] for tasks performed    Extremity/Trunk Assessment Right Upper Extremity Assessment RUE ROM/Strength/Tone: [facility] for tasks assessed Left Upper Extremity Assessment LUE ROM/Strength/Tone: WFL for tasks assessed     Mobility Bed Mobility Bed Mobility: Not assessed (Pt sitting EOB with RN upon arrival) Transfers Transfers: Sit to Stand;Stand to Sit Sit to Stand: 4: Min assist;From bed;With upper extremity assist Stand to Sit: 4: Min guard;To chair/3-in-1;With upper extremity assist;With armrests Details for Transfer Assistance: VC for safe hand placement.     Shoulder Instructions     Exercise     Balance     End of Session OT - End of Session Equipment Utilized During Treatment: Gait belt;Back brace Activity Tolerance: Patient tolerated treatment well Patient left: in chair;with call bell/phone within reach Nurse Communication: Mobility status  GO    10/01/2012 Cipriano Mile OTR/L Pager [phone] Office [phone]  Cipriano Mile 10/01/2012, 1:19 PM

## 2012-10-01 NOTE — Evaluation (Signed)
Physical Therapy Evaluation Patient Details Name: Paige Holland MRN: [id number] DOB: [birth year] Today's Date: 10/01/2012 Time: 1200-1221 PT Time Calculation (min): 21 min  PT Assessment / Plan / Recommendation Clinical Impression  Pt is 72 y/o female admitted for s/p PLIF T11-L3 and L3 hardware removal.  Pt very motivated and moving well.  Pt will benefit from acute PT services to improve mobility and overall independence.  Depending on conitnued progress pt may d/c home.  However pt considering returning to [facility] prior to d/c home for increase independence.     PT Assessment  Patient needs continued PT services    Follow Up Recommendations  SNF (may progress to HHPT )    Does the patient have the potential to tolerate intense rehabilitation      Barriers to Discharge None      Equipment Recommendations  None recommended by PT    Recommendations for Other Services     Frequency Min 5X/week    Precautions / Restrictions Precautions Precautions: Back Precaution Comments: Pt able to independently recall back precautions from last sx. Reviwed back precautions once again with pt. Required Braces or Orthoses: Spinal Brace Spinal Brace: Lumbar corset;Applied in sitting position Restrictions Weight Bearing Restrictions: No   Pertinent Vitals/Pain 2/10 back pain      Mobility  Bed Mobility Bed Mobility: Not assessed (Pt sitting EOB with RN upon arrival) Transfers Transfers: Sit to Stand;Stand to Sit Sit to Stand: 4: Min assist;From bed;With upper extremity assist Stand to Sit: 4: Min guard;To chair/3-in-1;With upper extremity assist;With armrests Details for Transfer Assistance: VC for safe hand placement. Ambulation/Gait Ambulation/Gait Assistance: 4: Min guard Ambulation Distance (Feet): 150 Feet Assistive device: Rolling walker Ambulation/Gait Assistance Details: minguard for safety with cues for technique.  Cues for RW placement and body position within RW Gait  Pattern: Decreased stride length;Shuffle;Decreased trunk rotation Stairs: No Wheelchair Mobility Wheelchair Mobility: No     PT Diagnosis: Difficulty walking;Acute pain  PT Problem List: Decreased strength;Decreased activity tolerance;Decreased mobility;Decreased knowledge of use of DME;Decreased knowledge of precautions;Pain PT Treatment Interventions: DME instruction;Gait training;Stair training;Functional mobility training;Therapeutic activities;Therapeutic exercise;Balance training;Patient/family education   PT Goals Acute Rehab PT Goals PT Goal Formulation: With patient Time For Goal Achievement: 10/08/12 Potential to Achieve Goals: Good Pt will Roll Supine to Right Side: with modified independence PT Goal: Rolling Supine to Right Side - Progress: Goal set today Pt will go Supine/Side to Sit: with modified independence PT Goal: Supine/Side to Sit - Progress: Goal set today Pt will go Sit to Supine/Side: with modified independence PT Goal: Sit to Supine/Side - Progress: Goal set today Pt will go Sit to Stand: with modified independence PT Goal: Sit to Stand - Progress: Goal set today Pt will go Stand to Sit: with modified independence PT Goal: Stand to Sit - Progress: Goal set today Pt will Ambulate: >150 feet;with modified independence;with rolling walker PT Goal: Ambulate - Progress: Goal set today Pt will Go Up / Down Stairs: 3-5 stairs;with min assist;with least restrictive assistive device PT Goal: Up/Down Stairs - Progress: Goal set today  Visit Information  Last PT Received On: 10/01/12 Assistance Needed: +1 PT/OT Co-Evaluation/Treatment: Yes    Subjective Data  Subjective: "I'm doing so much better after this past surgery. Patient Stated Goal: To eventually return home   Prior Functioning  Home Living Lives With: Son Available Help at Discharge: Family;Available PRN/intermittently Type of Home: House Home Access: Stairs to enter [employer] of  Steps: 3 Entrance Stairs-Rails: Right Home Layout:  One level Bathroom Shower/Tub: Engineer, manufacturing systems: Standard Home Adaptive Equipment: Bedside commode/3-in-1;Shower chair with back;Tub transfer bench;Walker - rolling;Walker - standard;Long-handled shoehorn;Reacher;Sock aid (rollator; toilet aid; bed rail) Prior Function Level of Independence: Independent with assistive device(s) Able to Take Stairs?: Yes Communication Communication: No difficulties Dominant Hand: Right    Cognition  Overall Cognitive Status: Appears within functional limits for tasks assessed/performed Arousal/Alertness: Awake/alert Orientation Level: Appears intact for tasks assessed Behavior During Session: [facility] for tasks performed    Extremity/Trunk Assessment Right Upper Extremity Assessment RUE ROM/Strength/Tone: [facility] for tasks assessed Left Upper Extremity Assessment LUE ROM/Strength/Tone: WFL for tasks assessed Right Lower Extremity Assessment RLE ROM/Strength/Tone: Unable to fully assess;Due to precautions Left Lower Extremity Assessment LLE ROM/Strength/Tone: Unable to fully assess;Due to precautions   Balance    End of Session PT - End of Session Equipment Utilized During Treatment: Gait belt;Back brace Activity Tolerance: Patient tolerated treatment well;Patient limited by pain Patient left: in chair;with call bell/phone within reach Nurse Communication: Mobility status;Precautions  GP     Paige Holland 10/01/2012, 2:24 PM Paige Holland, PT DPT [phone]

## 2012-10-02 LAB — GLUCOSE, CAPILLARY: Glucose-Capillary: 188 mg/dL — ABNORMAL HIGH (ref 70–99)

## 2012-10-02 NOTE — Progress Notes (Signed)
Subjective: Patient reports Considerable back pain postoperatively. Legs feel stable  Objective: Vital signs in last 24 hours: Temp:  [97.8 F (36.6 C)-99.1 F (37.3 C)] 99.1 F (37.3 C) (01/05 0601) Pulse Rate:  [57-84] 84  (01/05 0849) Resp:  [10-20] 18  (01/05 0601) BP: (102-146)/(34-54) 140/49 mmHg (01/05 0849) SpO2:  [93 %-100 %] 93 % (01/05 0601)  Intake/Output from previous day: 01/04 0701 - 01/05 0700 In: 375 [I.V.:375] Out: 3080 [Urine:3010; Drains:70] Intake/Output this shift:    Dressing is clean and dry. Motor function is good in lower extremities  Lab Results:  Basename 09/30/12 1330  WBC 8.3  HGB 9.8*  HCT 30.2*  PLT 240   BMET  Basename 09/30/12 1330  NA 136  K 3.6  CL 97  CO2 23  GLUCOSE 240*  BUN 22  CREATININE 0.91  CALCIUM 9.0    Studies/Results: Dg Lumbar Spine 2-3 Views  09/30/2012  *RADIOLOGY REPORT*  Clinical Data: T11-L3 extension of fusion  DG C-ARM 61-120 MIN,LUMBAR SPINE - 2-3 VIEW  Comparison:  Lumbar spine CT - 09/01/2012; lumbar spine radiographs - 07/12/2012  Findings:  Two intraoperative fluoroscopic images of the lower thoracic / upper lumbar spine are provided for review.  Vertebral body labeling is based on preprocedural lumbar spine radiographs and lumbar spine CT and the preexisting lumbar spine hardware.  There is been apparent removal of the previously noted bilateral pedicular screws at L2.  Interval cranial extension of previously noted paraspinal fusion hardware with bilateral pedicular screws now seen at T11, T12 and L1.  Crossbar fixation is seen posterior to the L1 - L2 intervertebral disc space.  Stable sequela of L2 - L3 and L3 - L4 intervertebral disc space replacement.  The inferior aspect of the paraspinal fusion hardware was not imaged.  IMPRESSION: 1.  Interval cranial extension of paraspinal fusion hardware as above. 2.  Interval removal of bilateral L2 pedicular screws.   Original Report Authenticated By: Tacey Ruiz,  MD    Dg C-arm [phone] Min  09/30/2012  *RADIOLOGY REPORT*  Clinical Data: T11-L3 extension of fusion  DG C-ARM 61-120 MIN,LUMBAR SPINE - 2-3 VIEW  Comparison:  Lumbar spine CT - 09/01/2012; lumbar spine radiographs - 07/12/2012  Findings:  Two intraoperative fluoroscopic images of the lower thoracic / upper lumbar spine are provided for review.  Vertebral body labeling is based on preprocedural lumbar spine radiographs and lumbar spine CT and the preexisting lumbar spine hardware.  There is been apparent removal of the previously noted bilateral pedicular screws at L2.  Interval cranial extension of previously noted paraspinal fusion hardware with bilateral pedicular screws now seen at T11, T12 and L1.  Crossbar fixation is seen posterior to the L1 - L2 intervertebral disc space.  Stable sequela of L2 - L3 and L3 - L4 intervertebral disc space replacement.  The inferior aspect of the paraspinal fusion hardware was not imaged.  IMPRESSION: 1.  Interval cranial extension of paraspinal fusion hardware as above. 2.  Interval removal of bilateral L2 pedicular screws.   Original Report Authenticated By: Tacey Ruiz, MD     Assessment/Plan: Stable postop day 2 from substantial revision T11-L3.  LOS: 2 days  Mobilize as tolerated we'll need placement. Patient has had previous discussions with [facility] skilled nursing facility where she was previously postop.   Nayali Talerico J 10/02/2012, 9:40 AM

## 2012-10-02 NOTE — Progress Notes (Signed)
Physical Therapy Treatment Patient Details Name: FREDRICKA KOHRS MRN: [id number] DOB: 06/15/1941 Today's Date: 10/02/2012 Time: 6962-9528 PT Time Calculation (min): 31 min  PT Assessment / Plan / Recommendation Comments on Treatment Session  Pt moving well this session, supervision for safety. Pt still wants to go to a SNF following hospital for independence prior to discharge home alone    Follow Up Recommendations  SNF     Does the patient have the potential to tolerate intense rehabilitation     Barriers to Discharge        Equipment Recommendations  None recommended by PT    Recommendations for Other Services    Frequency Min 5X/week   Plan Discharge plan remains appropriate;Frequency remains appropriate    Precautions / Restrictions Precautions Precautions: Back Precaution Comments: pt able to verbalize back precautions Required Braces or Orthoses: Spinal Brace Spinal Brace: Lumbar corset;Applied in sitting position Restrictions Weight Bearing Restrictions: No   Pertinent Vitals/Pain No complaints of pain    Mobility  Bed Mobility Bed Mobility: Rolling Right;Right Sidelying to Sit;Sitting - Scoot to Edge of Bed Rolling Right: 4: Min guard Right Sidelying to Sit: 4: Min guard Sitting - Scoot to [place] of Bed: 4: Min guard Details for Bed Mobility Assistance: Minguard for safety. Cues for proper technique with log rolling Transfers Transfers: Sit to Stand;Stand to Sit Sit to Stand: 4: Min guard;With upper extremity assist;From bed Stand to Sit: 4: Min guard;With upper extremity assist;To chair/3-in-1 Details for Transfer Assistance: VC for safe hand placement to/from RW Ambulation/Gait Ambulation/Gait Assistance: 4: Min guard Ambulation Distance (Feet): 200 Feet Assistive device: Rolling walker Ambulation/Gait Assistance Details: VC for safe technique with RW as well as upright posture. Gait Pattern: Decreased stride length;Shuffle;Decreased trunk rotation Gait  velocity: slow Stairs: No    Exercises     PT Diagnosis:    PT Problem List:   PT Treatment Interventions:     PT Goals Acute Rehab PT Goals PT Goal: Rolling Supine to Right Side - Progress: Progressing toward goal PT Goal: Supine/Side to Sit - Progress: Progressing toward goal PT Goal: Sit to Supine/Side - Progress: Progressing toward goal PT Goal: Sit to Stand - Progress: Progressing toward goal PT Goal: Stand to Sit - Progress: Progressing toward goal PT Goal: Ambulate - Progress: Progressing toward goal  Visit Information  Last PT Received On: 10/02/12 Assistance Needed: +1    Subjective Data      Cognition  Overall Cognitive Status: Appears within functional limits for tasks assessed/performed Arousal/Alertness: Awake/alert Orientation Level: Appears intact for tasks assessed Behavior During Session: [facility] for tasks performed    Balance     End of Session PT - End of Session Equipment Utilized During Treatment: Gait belt;Back brace Activity Tolerance: Patient tolerated treatment well;Patient limited by pain Patient left: in chair;with call bell/phone within reach Nurse Communication: Mobility status;Precautions   GP     [name] 10/02/2012, 3:04 PM

## 2012-10-03 LAB — GLUCOSE, CAPILLARY
Glucose-Capillary: 142 mg/dL — ABNORMAL HIGH (ref 70–99)
Glucose-Capillary: 201 mg/dL — ABNORMAL HIGH (ref 70–99)
Glucose-Capillary: 134 mg/dL — ABNORMAL HIGH (ref 70–99)

## 2012-10-03 NOTE — Progress Notes (Signed)
Occupational Therapy Treatment Patient Details Name: Paige Holland MRN: [id number] DOB: 08-12-1941 Today's Date: 10/03/2012 Time: 9629-5284 OT Time Calculation (min): 33 min  OT Assessment / Plan / Recommendation Comments on Treatment Session Pt. doing well with BADLs, demonstrates good understanding of back precautions, and demonstrates good awareness of back precautions.   (Pt very motivated)    Follow Up Recommendations  SNF    Barriers to Discharge       Equipment Recommendations  None recommended by OT    Recommendations for Other Services    Frequency Min 2X/week   Plan Discharge plan remains appropriate    Precautions / Restrictions Precautions Precautions: Back Precaution Booklet Issued: Yes (comment) Precaution Comments: pt able to verbalize back precautions Required Braces or Orthoses: Spinal Brace Spinal Brace: Lumbar corset;Applied in sitting position Restrictions Weight Bearing Restrictions: No   Pertinent Vitals/Pain     ADL  Lower Body Bathing: Min guard;Simulated Where Assessed - Lower Body Bathing: Supported sit to stand Lower Body Dressing: Min guard Where Assessed - Lower Body Dressing: Supported sit to Pharmacist, hospital: Min Musician Method: Sit to stand Equipment Used: Back brace;Rolling walker Transfers/Ambulation Related to ADLs: Ambulates min guard with RW ADL Comments: Pt. able to verbalize understanding of how to use AE for LB ADLs.  Also, able to access both feet by crossing ankles over feet to don/doff socks, pants, and bathe feet.  Pt. demonstrates good awareness of precautions, and dons brace with supervision    OT Diagnosis:    OT Problem List:   OT Treatment Interventions:     OT Goals ADL Goals ADL Goal: Lower Body Dressing - Progress: Progressing toward goals ADL Goal: Toilet Transfer - Progress: Progressing toward goals ADL Goal: Toileting - Clothing Manipulation - Progress: Progressing toward  goals Miscellaneous OT Goals OT Goal: Miscellaneous Goal #1 - Progress: Progressing toward goals  Visit Information  Last OT Received On: 10/03/12 Assistance Needed: +1    Subjective Data      Prior Functioning       Cognition  Overall Cognitive Status: Appears within functional limits for tasks assessed/performed Arousal/Alertness: Awake/alert Orientation Level: Appears intact for tasks assessed Behavior During Session: [facility] for tasks performed    Mobility  Shoulder Instructions Bed Mobility Bed Mobility: Rolling Left;Left Sidelying to Sit;Sit to Sidelying Left Rolling Left: 5: Supervision;With rail Right Sidelying to Sit: 5: Supervision;With rails Left Sidelying to Sit: 5: Supervision;With rails Sitting - Scoot to Edge of Bed: 5: Supervision Sit to Sidelying Left: With rail;4: Min guard Details for Bed Mobility Assistance: Pt requires min cues for scooting hips horizontally in bed Transfers Transfers: Sit to Stand;Stand to Sit Sit to Stand: 4: Min guard;With upper extremity assist;With armrests;From chair/3-in-1;From bed Stand to Sit: 4: Min guard;To chair/3-in-1;With upper extremity assist;To bed Details for Transfer Assistance: v/c's for hand placement       Exercises      Balance     End of Session OT - End of Session Equipment Utilized During Treatment: Back brace Activity Tolerance: Patient tolerated treatment well Patient left: in bed;with call bell/phone within reach;with bed alarm set  GO     Jakirah Zaun, Ursula Alert M 10/03/2012, 12:54 PM

## 2012-10-03 NOTE — Progress Notes (Signed)
Physical Therapy Treatment Patient Details Name: Paige Holland MRN: [id number] DOB: 09/06/1941 Today's Date: 10/03/2012 Time: 4098-1191 PT Time Calculation (min): 17 min  PT Assessment / Plan / Recommendation Comments on Treatment Session  Pt progressing well this date. Pt con't to need assist for IADLs and live alone. Pt to con't to benefit from SNF placement upon d/c to achieve safe independent function prior to transition home alone.    Follow Up Recommendations  SNF     Does the patient have the potential to tolerate intense rehabilitation     Barriers to Discharge        Equipment Recommendations  None recommended by PT    Recommendations for Other Services    Frequency Min 5X/week   Plan Discharge plan remains appropriate;Frequency remains appropriate    Precautions / Restrictions Precautions Precautions: Back Precaution Booklet Issued: Yes (comment) Precaution Comments: pt able to verbalize back precautions Required Braces or Orthoses: Spinal Brace Spinal Brace: Lumbar corset;Applied in sitting position Restrictions Weight Bearing Restrictions: No   Pertinent Vitals/Pain 5/10 surgical back pain    Mobility  Bed Mobility Bed Mobility: Not assessed Transfers Transfers: Sit to Stand;Stand to Sit Sit to Stand: 4: Min guard;With upper extremity assist;With armrests;From chair/3-in-1 Stand to Sit: 4: Min guard;With upper extremity assist;To chair/3-in-1 Details for Transfer Assistance: v/c's for hand placement Ambulation/Gait Ambulation/Gait Assistance: 4: Min guard Ambulation Distance (Feet): 250 Feet Assistive device: Rolling walker Ambulation/Gait Assistance Details:  pt demo'd safe technique, increased bilat UE weight-bearing Gait Pattern: Step-through pattern;Decreased stride length Gait velocity: cautious Stairs: No    Exercises     PT Diagnosis:    PT Problem List:   PT Treatment Interventions:     PT Goals Acute Rehab PT Goals PT Goal: Sit to  Stand - Progress: Progressing toward goal PT Goal: Stand to Sit - Progress: Progressing toward goal PT Goal: Ambulate - Progress: Progressing toward goal  Visit Information  Last PT Received On: 10/03/12 Assistance Needed: +1    Subjective Data  Subjective: Pt received sitting up in chair with report "I'd love to walk."   Cognition  Overall Cognitive Status: Appears within functional limits for tasks assessed/performed Arousal/Alertness: Awake/alert Orientation Level: Appears intact for tasks assessed Behavior During Session: [facility] for tasks performed    Balance     End of Session PT - End of Session Equipment Utilized During Treatment: Gait belt;Back brace Activity Tolerance: Patient tolerated treatment well Patient left: in chair;with call bell/phone within reach Nurse Communication: Mobility status;Precautions   GP     Marcene Brawn 10/03/2012, 10:56 AM  Lewis Shock, PT, DPT Pager #: [phone] Office #: [phone]

## 2012-10-03 NOTE — Progress Notes (Signed)
Foley removed at 6:15 pm per MD order.  Waiting for patient to void. Call be in reach and patient instructed to call when she feels she needs to use the bathroom.  Pt resting comfortably.

## 2012-10-03 NOTE — Progress Notes (Signed)
Subjective: Patient reports She's doing well no leg pain back pain is much. Today the yesterday  Objective: Vital signs in last 24 hours: Temp:  [97.9 F (36.6 C)-99.3 F (37.4 C)] 98.5 F (36.9 C) (01/06 1023) Pulse Rate:  [72-79] 72  (01/06 1023) Resp:  [17-20] 18  (01/06 1023) BP: (101-134)/(31-50) 134/46 mmHg (01/06 1023) SpO2:  [93 %-97 %] 97 % (01/06 1023)  Intake/Output from previous day: 01/05 0701 - 01/06 0700 In: -  Out: 3810 [Urine:3750; Drains:60] Intake/Output this shift: Total I/O In: -  Out: 900 [Urine:900]  Strength is 5 out of 5 wound is clean dry  Lab Results:  Basename 09/30/12 1330  WBC 8.3  HGB 9.8*  HCT 30.2*  PLT 240   BMET  Basename 09/30/12 1330  NA 136  K 3.6  CL 97  CO2 23  GLUCOSE 240*  BUN 22  CREATININE 0.91  CALCIUM 9.0    Studies/Results: No results found.  Assessment/Plan: DC her Foley mobilize physical outpatient therapy we'll push discharge her tomorrow the extent patient will probably need to go to [facility] nursing home.  LOS: 3 days     Holley Kocurek P 10/03/2012, 12:28 PM

## 2012-10-04 LAB — GLUCOSE, CAPILLARY
Glucose-Capillary: 102 mg/dL — ABNORMAL HIGH (ref 70–99)
Glucose-Capillary: 105 mg/dL — ABNORMAL HIGH (ref 70–99)
Glucose-Capillary: 119 mg/dL — ABNORMAL HIGH (ref 70–99)

## 2012-10-04 MED ORDER — INSULIN ASPART 100 UNIT/ML ~~LOC~~ SOLN
0.0000 [IU] | Freq: Three times a day (TID) | SUBCUTANEOUS | Status: DC
  Administered 2012-10-05: 4 [IU] via SUBCUTANEOUS

## 2012-10-04 MED FILL — Sodium Chloride Irrigation Soln 0.9%: Qty: 3000 | Status: AC

## 2012-10-04 MED FILL — Heparin Sodium (Porcine) Inj 1000 Unit/ML: INTRAMUSCULAR | Qty: 30 | Status: AC

## 2012-10-04 MED FILL — Sodium Chloride IV Soln 0.9%: INTRAVENOUS | Qty: 1000 | Status: AC

## 2012-10-04 NOTE — Progress Notes (Signed)
Subjective: Patient reports Patient great pains well-controlled  Objective: Vital signs in last 24 hours: Temp:  [98.5 F (36.9 C)-99.5 F (37.5 C)] 98.5 F (36.9 C) (01/07 0519) Pulse Rate:  [69-73] 69  (01/07 0519) Resp:  [18-20] 18  (01/07 0519) BP: (89-134)/(31-60) 119/45 mmHg (01/07 0519) SpO2:  [95 %-100 %] 100 % (01/07 0519)  Intake/Output from previous day: 01/06 0701 - 01/07 0700 In: 120 [P.O.:120] Out: 2200 [Urine:2100; Drains:100] Intake/Output this shift: Total I/O In: 120 [P.O.:120] Out: -   Strength out of 5 wound clean dry and baseline dorsiflexion weakness of her right foot  Lab Results: No results found for this basename: WBC:2,HGB:2,HCT:2,PLT:2 in the last 72 hours BMET No results found for this basename: NA:2,K:2,CL:2,CO2:2,GLUCOSE:2,BUN:2,CREATININE:2,CALCIUM:2 in the last 72 hours  Studies/Results: No results found.  Assessment/Plan: Work on discharge placement  LOS: 4 days     Xane Amsden P 10/04/2012, 6:42 AM

## 2012-10-04 NOTE — Progress Notes (Signed)
Physical Therapy Treatment Patient Details Name: Paige Holland MRN: [id number] DOB: Feb 16, 1941 Today's Date: 10/04/2012 Time: 9629-5284 PT Time Calculation (min): 17 min  PT Assessment / Plan / Recommendation Comments on Treatment Session  Pt con't to progress well but remains to requires assist for IADLs and remains to require use of RW for safe ambulation    Follow Up Recommendations  SNF     Does the patient have the potential to tolerate intense rehabilitation     Barriers to Discharge        Equipment Recommendations  None recommended by PT    Recommendations for Other Services    Frequency Min 3X/week   Plan Discharge plan remains appropriate    Precautions / Restrictions Precautions Precautions: Back Precaution Booklet Issued: Yes (comment) Precaution Comments: pt able to verbalize back precautions Required Braces or Orthoses: Spinal Brace Spinal Brace: Lumbar corset;Applied in sitting position Restrictions Weight Bearing Restrictions: No   Pertinent Vitals/Pain 7/10 surgical back pain    Mobility  Bed Mobility Bed Mobility: Not assessed Transfers Transfers: Sit to Stand;Stand to Sit Sit to Stand: 5: Supervision;With upper extremity assist;From chair/3-in-1 Stand to Sit: 5: Supervision;With armrests;To chair/3-in-1 Details for Transfer Assistance: v/c's for safe hand placement Ambulation/Gait Ambulation/Gait Assistance: 4: Min guard Ambulation Distance (Feet): 300 Feet Assistive device: Rolling walker Ambulation/Gait Assistance Details: pt con't to require use of RW, encouraged dec bilat UE WBing, no episodes of LOB Gait Pattern: Step-through pattern;Decreased stride length Gait velocity: slow General Gait Details: v/c's to limit quick mvmts as pt with poor walker management and at increased fall risk when trying to turn to fast Stairs: No    Exercises     PT Diagnosis:    PT Problem List:   PT Treatment Interventions:     PT Goals Acute Rehab PT  Goals PT Goal: Sit to Stand - Progress: Progressing toward goal PT Goal: Stand to Sit - Progress: Progressing toward goal PT Goal: Ambulate - Progress: Progressing toward goal  Visit Information  Last PT Received On: 10/04/12 Assistance Needed: +1    Subjective Data  Subjective: Pt received up in chair with report "I'm pretty sore today."   Cognition  Overall Cognitive Status: Appears within functional limits for tasks assessed/performed Arousal/Alertness: Awake/alert Orientation Level: Appears intact for tasks assessed Behavior During Session: [facility] for tasks performed    Balance     End of Session PT - End of Session Equipment Utilized During Treatment: Gait belt;Back brace Activity Tolerance: Patient tolerated treatment well Patient left: in chair;with call bell/phone within reach Nurse Communication: Mobility status;Precautions   GP     Marcene Brawn 10/04/2012, 5:00 PM  Lewis Shock, PT, DPT Pager #: [phone] Office #: [phone]

## 2012-10-04 NOTE — Clinical Social Work Psychosocial (Signed)
     Clinical Social Work Department BRIEF PSYCHOSOCIAL ASSESSMENT 10/04/2012  Patient:  Paige Holland, Paige Holland     Account Number:  [id number]     Admit date:  09/30/2012  Clinical Social Worker:  Peggyann Shoals  Date/Time:  10/04/2012 01:32 PM  Referred by:  Physician  Date Referred:  10/04/2012 Referred for  SNF Placement   Other Referral:   Interview type:  Patient Other interview type:    PSYCHOSOCIAL DATA Living Status:  ALONE Admitted from facility:   Level of care:   Primary support name:  Octavia Bruckner Primary support relationship to patient:  CHILD, ADULT Degree of support available:   adequate    CURRENT CONCERNS Current Concerns  Post-Acute Placement   Other Concerns:    SOCIAL WORK ASSESSMENT / PLAN CSW met with pt to address consult. CSW introduced herself and explained role of social work. CSW also explained the process of discharging to SNF.    Pt shared that she does not have any one that is able to take care of her during the day and has been to [facility] in the past. Pt shared that when she found out that she that she need additional surgery, she contacted [facility].    CSW will contact [facility] and initiate referral. CSW will follow up with bed offer. CSW will continue to follow.   Assessment/plan status:  Psychosocial Support/Ongoing Assessment of Needs Other assessment/ plan:   Information/referral to community resources:   SNF List    PATIENTS/FAMILYS RESPONSE TO PLAN OF CARE: Pt was very pleasant and aggreeable to SNF at discharge.

## 2012-10-05 LAB — GLUCOSE, CAPILLARY
Glucose-Capillary: 69 mg/dL — ABNORMAL LOW (ref 70–99)
Glucose-Capillary: 78 mg/dL (ref 70–99)

## 2012-10-05 MED ORDER — HYDROMORPHONE HCL 4 MG PO TABS: 4.0000 mg | ORAL_TABLET | ORAL | Status: AC | PRN

## 2012-10-05 NOTE — Clinical Social Work Placement (Signed)
    Clinical Social Work Department CLINICAL SOCIAL WORK PLACEMENT NOTE 10/05/2012  Patient:  Paige Holland, Paige Holland  Account Number:  [id number] Admit date:  09/30/2012  Clinical Social Worker:  Peggyann Shoals  Date/time:  10/04/2012 04:00 PM  Clinical Social Work is seeking post-discharge placement for this patient at the following level of care:   SKILLED NURSING   (*CSW will update this form in Epic as items are completed)   10/04/2012  Patient/family provided with [facility] Department of Clinical Social Work's list of facilities offering this level of care within the geographic area requested by the patient (or if unable, by the patient's family).  10/04/2012  Patient/family informed of their freedom to choose among providers that offer the needed level of care, that participate in Medicare, Medicaid or managed care program needed by the patient, have an available bed and are willing to accept the patient.  10/04/2012  Patient/family informed of [facility]' ownership interest in [facility], as well as of the fact that they are under no obligation to receive care at this facility.  PASARR submitted to EDS on 10/04/2012 PASARR number received from [employer]DS on 10/04/2012  FL2 transmitted to all facilities in geographic area requested by pt/family on  10/05/2012 FL2 transmitted to all facilities within larger geographic area on   Patient informed that his/her managed care company has contracts with or will negotiate with  certain facilities, including the following:     Patient/family informed of bed offers received:  10/04/2012 Patient chooses bed at [facility] Physician recommends and patient chooses bed at  SNF  Patient to be transferred to [facility] Patient to be transferred to facility by [facility]  The following physician request were entered in Epic:   Additional Comments:

## 2012-10-05 NOTE — Discharge Summary (Signed)
Physician Discharge Summary  Patient ID: Paige Holland MRN: [id number] DOB/AGE: 1941-02-07 72 y.o.  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 *    Discharged Condition: good  Hospital Course: Patient was admitted to the hospital underwent the aforementioned at T11-L3 fusion. Postoperatively patient did very well went to the ICU recovered well the [facility] was able to transfer the floor and posterior day 1 continued convalesced well there and the floor progressively getting stronger pain become under better control was angling and voiding spontaneously tolerating regular diet and pain was well controlled on pills. Patient was still be discharged to [facility] where she will go for extended rehabilitation.  Consults: Significant Diagnostic Studies: Treatments: T11-L3 fusion Discharge Exam: Blood pressure 117/47, pulse 40, temperature 98.6 F (37 C), temperature source Oral, resp. rate 18, height 5\' 2"  (1.575 m), weight 102.9 kg (226 lb 13.7 oz), SpO2 90.00%. Strength out of 5 wound clean dry  Disposition: [facility] nursing home     Medication List     As of 10/05/2012 11:06 AM    TAKE these medications         allopurinol 300 MG tablet   Commonly known as: ZYLOPRIM   Take 300 mg by mouth every morning.      aspirin EC 81 MG tablet   Take 81 mg by mouth daily.      atenolol 50 MG tablet   Commonly known as: TENORMIN   Take 50 mg by mouth every morning.      atorvastatin 40 MG tablet   Commonly known as: LIPITOR   Take 40 mg by mouth at bedtime.      CALCIUM 1200 PO   Take 1 tablet by mouth every morning.      Fish Oil 1000 MG Caps   Take 1 capsule by mouth 3 (three) times daily.      furosemide 40 MG tablet   Commonly known as: LASIX   Take 40 mg by mouth daily.      glimepiride 4 MG tablet   Commonly known as: AMARYL   Take 4 mg by mouth 2 (two) times daily.      HYDROmorphone 4 MG tablet   Commonly known as: DILAUDID   Take 1 tablet (4 mg total) by mouth every 4 (four) hours as needed.      insulin glargine 100 UNIT/ML injection   Commonly known as: LANTUS   Inject 25 Units into the skin at bedtime.      levothyroxine 125 MCG tablet   Commonly known as: SYNTHROID, LEVOTHROID   Take 125 mcg by mouth daily.      losartan 100 MG tablet   Commonly known as: COZAAR   Take 100 mg by mouth every morning.      metFORMIN 1000 MG tablet   Commonly known as: GLUCOPHAGE   Take 1,000 mg by mouth 2 (two) times daily with a meal.      multivitamin with minerals Tabs   Take 1 tablet by mouth daily.      omeprazole 20 MG capsule   Commonly known as: PRILOSEC   Take 20 mg by mouth daily.      oxyCODONE-acetaminophen 5-325 MG per tablet   Commonly known as: PERCOCET/ROXICET   Take 1-2 tablets by mouth every 4 (four) hours as needed. For pain  pioglitazone 45 MG tablet   Commonly known as: ACTOS   Take 45 mg by mouth every morning.      potassium chloride SA 20 MEQ tablet   Commonly known as: K-DUR,KLOR-CON   Take 20 mEq by mouth every morning.      pregabalin 50 MG capsule   Commonly known as: LYRICA   Take 50 mg by mouth at bedtime.      rOPINIRole 1 MG tablet   Commonly known as: REQUIP   Take 1 mg by mouth at bedtime.      traMADol 50 MG tablet   Commonly known as: ULTRAM   Take 50 mg by mouth every 6 (six) hours as needed. For pain      vitamin B-12 1000 MCG tablet   Commonly known as: CYANOCOBALAMIN   Take 1,000 mcg by mouth every morning.      Vitamin D 2000 UNITS tablet   Take 2,000 Units by mouth every morning.         Signed: Kanna Dafoe P 10/05/2012, 11:06 AM

## 2012-10-05 NOTE — Progress Notes (Signed)
Subjective: Patient reports She's feeling well minimal back pain minimal leg pain she's ready for transfer  Objective: Vital signs in last 24 hours: Temp:  [98.2 F (36.8 C)-99.9 F (37.7 C)] 98.6 F (37 C) (01/08 1000) Pulse Rate:  [40-83] 40  (01/08 1000) Resp:  [18-20] 18  (01/08 1000) BP: (103-125)/(26-49) 117/47 mmHg (01/08 1000) SpO2:  [90 %-100 %] 90 % (01/08 1000)  Intake/Output from previous day: 01/07 0701 - 01/08 0700 In: 600 [P.O.:600] Out: -  Intake/Output this shift:    Awake alert oriented strength out of 5 wound clean and dry  Lab Results: No results found for this basename: WBC:2,HGB:2,HCT:2,PLT:2 in the last 72 hours BMET No results found for this basename: NA:2,K:2,CL:2,CO2:2,GLUCOSE:2,BUN:2,CREATININE:2,CALCIUM:2 in the last 72 hours  Studies/Results: No results found.  Assessment/Plan: Transfer to [facility]  LOS: 5 days     Paige Holland P 10/05/2012, 11:03 AM

## 2012-10-05 NOTE — Progress Notes (Signed)
Hypoglycemic Event  CBG: 69   Treatment: 15 GM carbohydrate snack  Symptoms: None  Follow-up CBG: Time:740  CBG Result:81   Possible Reasons for Event: Unknown  Comments/MD notified: OJ given. Patient's breakfast in front of her.     Jacqulyn Ducking E  Remember to initiate Hypoglycemia Order Set & complete

## 2012-10-05 NOTE — Progress Notes (Signed)
Physical Therapy Treatment Patient Details Name: Paige Holland MRN: [id number] DOB: 10-28-1940 Today's Date: 10/05/2012 Time: 1201-1215 PT Time Calculation (min): 14 min  PT Assessment / Plan / Recommendation Comments on Treatment Session  Pt very pleasant & willing to participate in PT session.  Plans are for d/c to [facility] today per pt but she was still agreeable to participate in therapy.      Follow Up Recommendations  SNF     Does the patient have the potential to tolerate intense rehabilitation     Barriers to Discharge        Equipment Recommendations  None recommended by PT    Recommendations for Other Services    Frequency Min 3X/week   Plan Discharge plan remains appropriate    Precautions / Restrictions Precautions Precautions: Back Precaution Comments: Pt (I)'ly verbalized all 3 back precautions.  Maintains adherence to precautions well Required Braces or Orthoses: Spinal Brace Spinal Brace: Lumbar corset;Applied in sitting position Restrictions Weight Bearing Restrictions: No       Mobility  Bed Mobility Bed Mobility: Not assessed Transfers Transfers: Sit to Stand;Stand to Sit Sit to Stand: 6: Modified independent (Device/Increase time);With upper extremity assist;With armrests;From chair/3-in-1 Stand to Sit: 6: Modified independent (Device/Increase time);With upper extremity assist;With armrests;To chair/3-in-1 Ambulation/Gait Ambulation/Gait Assistance: 5: Supervision Ambulation Distance (Feet): 300 Feet Assistive device: Rolling walker Ambulation/Gait Assistance Details: Cont to encourage decreased reliance of UE's on RW.   Gait Pattern: Step-through pattern;Decreased stride length Gait velocity: slow Stairs: No Wheelchair Mobility Wheelchair Mobility: No     PT Goals Acute Rehab PT Goals Time For Goal Achievement: 10/08/12 Potential to Achieve Goals: Good Pt will Roll Supine to Right Side: with modified independence Pt will go Supine/Side to  Sit: with modified independence Pt will go Sit to Supine/Side: with modified independence Pt will go Sit to Stand: with modified independence PT Goal: Sit to Stand - Progress: Met Pt will go Stand to Sit: with modified independence PT Goal: Stand to Sit - Progress: Met Pt will Ambulate: >150 feet;with modified independence;with rolling walker PT Goal: Ambulate - Progress: Progressing toward goal Pt will Go Up / Down Stairs: 3-5 stairs;with min assist;with least restrictive assistive device  Visit Information  Last PT Received On: 10/05/12 Assistance Needed: +1    Subjective Data      Cognition  Overall Cognitive Status: Appears within functional limits for tasks assessed/performed Arousal/Alertness: Awake/alert Orientation Level: Appears intact for tasks assessed Behavior During Session: [facility] for tasks performed    Balance     End of Session PT - End of Session Equipment Utilized During Treatment: Gait belt;Back brace Activity Tolerance: Patient tolerated treatment well Patient left: in chair;with call bell/phone within reach     [place], [place] [phone] 10/05/2012

## 2012-10-05 NOTE — Progress Notes (Signed)
Pt dc instructions provided. Pt verbalized understanding. Iv dc intact. Pt prescription placed in folder to go to facility. Pt under no s/s distress.

## 2012-10-05 NOTE — Clinical Social Work Note (Signed)
CSW was consulted to complete discharge of patient. Pt to transfer to [facility] today via [facility]. Patient and facility are aware of d/c. D/C packet complete with chart copy, signed FL2, and signed hard Rx.  CSW signing off as no other CSW needs identified at this time.  Lia Foyer, LCSWA [facility] Clinical Social Worker Contact #: [phone] (PRN)

## 2012-10-06 NOTE — Care Management Note (Signed)
    Page 1 of 1   10/06/2012     12:55:05 PM   CARE MANAGEMENT NOTE 10/06/2012  Patient:  Paige Holland, Paige Holland   Account Number:  [id number]  Date Initiated:  10/04/2012  Documentation initiated by:  [facility]  Subjective/Objective Assessment:   Admitted postop  PLIF     Action/Plan:   PT/OT evals- recommending SNF   Anticipated DC Date:  10/05/2012   Anticipated DC Plan:  SKILLED NURSING FACILITY  In-house referral  Clinical Social Worker      DC Planning Services  CM consult      Choice offered to / List presented to:             Status of service:  Completed, signed off Medicare Important Message given?   (If response is "NO", the following Medicare IM given date fields will be blank) Date Medicare IM given:   Date Additional Medicare IM given:    Discharge Disposition:  SKILLED NURSING FACILITY  Per UR Regulation:  Reviewed for med. necessity/level of care/duration of stay  If discussed at Long Length of Stay Meetings, dates discussed:    Comments:

## 2015-07-28 ENCOUNTER — Emergency Department (HOSPITAL_COMMUNITY): Payer: Medicare Other

## 2015-07-28 ENCOUNTER — Encounter (HOSPITAL_COMMUNITY): Payer: Self-pay | Admitting: *Deleted

## 2015-07-28 ENCOUNTER — Emergency Department (HOSPITAL_COMMUNITY)
Admission: EM | Admit: 2015-07-28 | Discharge: 2015-07-28 | Disposition: A | Discharge status: Home / Self Care | Payer: Medicare Other | Attending: Emergency Medicine | Admitting: Emergency Medicine

## 2015-07-28 DIAGNOSIS — G629 Polyneuropathy, unspecified: Secondary | ICD-10-CM | POA: Insufficient documentation

## 2015-07-28 DIAGNOSIS — R627 Adult failure to thrive: Secondary | ICD-10-CM | POA: Insufficient documentation

## 2015-07-28 DIAGNOSIS — S0083XA Contusion of other part of head, initial encounter: Secondary | ICD-10-CM | POA: Diagnosis not present

## 2015-07-28 DIAGNOSIS — M109 Gout, unspecified: Secondary | ICD-10-CM | POA: Diagnosis not present

## 2015-07-28 DIAGNOSIS — W1839XA Other fall on same level, initial encounter: Secondary | ICD-10-CM | POA: Insufficient documentation

## 2015-07-28 DIAGNOSIS — F0390 Unspecified dementia without behavioral disturbance: Secondary | ICD-10-CM | POA: Diagnosis not present

## 2015-07-28 DIAGNOSIS — R011 Cardiac murmur, unspecified: Secondary | ICD-10-CM | POA: Insufficient documentation

## 2015-07-28 DIAGNOSIS — G8929 Other chronic pain: Secondary | ICD-10-CM | POA: Diagnosis not present

## 2015-07-28 DIAGNOSIS — I10 Essential (primary) hypertension: Secondary | ICD-10-CM | POA: Diagnosis not present

## 2015-07-28 DIAGNOSIS — Z7982 Long term (current) use of aspirin: Secondary | ICD-10-CM | POA: Diagnosis not present

## 2015-07-28 DIAGNOSIS — Z7902 Long term (current) use of antithrombotics/antiplatelets: Secondary | ICD-10-CM | POA: Insufficient documentation

## 2015-07-28 DIAGNOSIS — E785 Hyperlipidemia, unspecified: Secondary | ICD-10-CM | POA: Diagnosis not present

## 2015-07-28 DIAGNOSIS — Y9389 Activity, other specified: Secondary | ICD-10-CM | POA: Insufficient documentation

## 2015-07-28 DIAGNOSIS — Y998 Other external cause status: Secondary | ICD-10-CM | POA: Diagnosis not present

## 2015-07-28 DIAGNOSIS — Z794 Long term (current) use of insulin: Secondary | ICD-10-CM | POA: Diagnosis not present

## 2015-07-28 DIAGNOSIS — Z862 Personal history of diseases of the blood and blood-forming organs and certain disorders involving the immune mechanism: Secondary | ICD-10-CM | POA: Diagnosis not present

## 2015-07-28 DIAGNOSIS — Z85820 Personal history of malignant melanoma of skin: Secondary | ICD-10-CM | POA: Insufficient documentation

## 2015-07-28 DIAGNOSIS — H269 Unspecified cataract: Secondary | ICD-10-CM | POA: Diagnosis not present

## 2015-07-28 DIAGNOSIS — E119 Type 2 diabetes mellitus without complications: Secondary | ICD-10-CM | POA: Insufficient documentation

## 2015-07-28 DIAGNOSIS — Z87442 Personal history of urinary calculi: Secondary | ICD-10-CM | POA: Diagnosis not present

## 2015-07-28 DIAGNOSIS — R7989 Other specified abnormal findings of blood chemistry: Secondary | ICD-10-CM

## 2015-07-28 DIAGNOSIS — K219 Gastro-esophageal reflux disease without esophagitis: Secondary | ICD-10-CM | POA: Insufficient documentation

## 2015-07-28 DIAGNOSIS — Z79899 Other long term (current) drug therapy: Secondary | ICD-10-CM | POA: Insufficient documentation

## 2015-07-28 DIAGNOSIS — Z87440 Personal history of urinary (tract) infections: Secondary | ICD-10-CM | POA: Insufficient documentation

## 2015-07-28 DIAGNOSIS — E860 Dehydration: Secondary | ICD-10-CM | POA: Diagnosis present

## 2015-07-28 DIAGNOSIS — Y92009 Unspecified place in unspecified non-institutional (private) residence as the place of occurrence of the external cause: Secondary | ICD-10-CM | POA: Insufficient documentation

## 2015-07-28 DIAGNOSIS — E039 Hypothyroidism, unspecified: Secondary | ICD-10-CM | POA: Diagnosis not present

## 2015-07-28 DIAGNOSIS — R778 Other specified abnormalities of plasma proteins: Secondary | ICD-10-CM | POA: Diagnosis not present

## 2015-07-28 LAB — CBC WITH DIFFERENTIAL/PLATELET
BASOS ABS: 0 10*3/uL (ref 0.0–0.1)
BASOS PCT: 1 %
EOS ABS: 0.2 10*3/uL (ref 0.0–0.7)
Eosinophils Relative: 3 %
HCT: 42.7 % (ref 36.0–46.0)
HEMOGLOBIN: 14.4 g/dL (ref 12.0–15.0)
Lymphocytes Relative: 19 %
Lymphs Abs: 1.3 10*3/uL (ref 0.7–4.0)
MCH: 30.4 pg (ref 26.0–34.0)
MCHC: 33.7 g/dL (ref 30.0–36.0)
MCV: 90.3 fL (ref 78.0–100.0)
MONOS PCT: 12 %
Monocytes Absolute: 0.8 10*3/uL (ref 0.1–1.0)
NEUTROS PCT: 65 %
Neutro Abs: 4.4 10*3/uL (ref 1.7–7.7)
Platelets: 311 10*3/uL (ref 150–400)
RBC: 4.73 MIL/uL (ref 3.87–5.11)
RDW: 14.8 % (ref 11.5–15.5)
WBC: 6.8 10*3/uL (ref 4.0–10.5)

## 2015-07-28 LAB — COMPREHENSIVE METABOLIC PANEL
ALBUMIN: 2.9 g/dL — AB (ref 3.5–5.0)
ALK PHOS: 91 U/L (ref 38–126)
ALT: 38 U/L (ref 14–54)
ANION GAP: 14 (ref 5–15)
AST: 68 U/L — ABNORMAL HIGH (ref 15–41)
BUN: 15 mg/dL (ref 6–20)
CALCIUM: 9.1 mg/dL (ref 8.9–10.3)
CO2: 28 mmol/L (ref 22–32)
CREATININE: 0.93 mg/dL (ref 0.44–1.00)
Chloride: 95 mmol/L — ABNORMAL LOW (ref 101–111)
GFR calc Af Amer: >60 mL/min (ref >60)
GFR calc non Af Amer: 59 mL/min — ABNORMAL LOW (ref >60)
Glucose, Bld: 316 mg/dL — ABNORMAL HIGH (ref 65–99)
Potassium: 4.7 mmol/L (ref 3.5–5.1)
SODIUM: 137 mmol/L (ref 135–145)
TOTAL PROTEIN: 6.3 g/dL — AB (ref 6.5–8.1)
Total Bilirubin: 1.8 mg/dL — ABNORMAL HIGH (ref 0.3–1.2)

## 2015-07-28 LAB — URINALYSIS, ROUTINE W REFLEX MICROSCOPIC
HGB URINE DIPSTICK: NEGATIVE
KETONES UR: 40 mg/dL — AB
Leukocytes, UA: NEGATIVE
Nitrite: NEGATIVE
Specific Gravity, Urine: 1.028 (ref 1.005–1.030)
UROBILINOGEN UA: 1 mg/dL (ref 0.0–1.0)
pH: 5.5 (ref 5.0–8.0)

## 2015-07-28 LAB — URINE MICROSCOPIC-ADD ON

## 2015-07-28 LAB — TROPONIN I: TROPONIN I: 0.06 ng/mL — AB (ref <0.031)

## 2015-07-28 MED ORDER — SODIUM CHLORIDE 0.9 % IV BOLUS (SEPSIS)
1000.0000 mL | Freq: Once | INTRAVENOUS | Status: AC
  Administered 2015-07-28: 1000 mL via INTRAVENOUS

## 2015-07-28 NOTE — Discharge Instructions (Signed)
He was seen today for failure to thrive. Your workup is largely reassuring. However, your heart test was mildly elevated. Given her goals of care and the fact that your DO NOT RESUSCITATE, it was decided that you should follow-up closely with her primary physician given that you're not having ongoing chest pain.  Failure to Thrive, Adult Failure to thrive is a group of problems. These problems include eating too little and losing weight. People who have this condition may do fewer and fewer activities over time. They may lose interest in being with friends or they may not want to eat or drink. HOME CARE  Take medicines only as told by your doctor.  Eat a healthy, well-balanced diet. Make sure that you eat enough.  Be active. Do strength training. A physical therapist can help to set up an exercise program that fits you.  Make sure that you are safe at home.  Make sure that you have a plan for what to do if you cannot make decisions for yourself. GET HELP IF:  You are not able to eat well.  You are not able to move around.  You feel very sad.  You feel very hopeless. GET HELP RIGHT AWAY IF:  You think about ending your life.  You cannot eat or drink.  You do not get out of bed.  Staying at home is not safe.  You have a fever.   This information is not intended to replace advice given to you by your health care provider. Make sure you discuss any questions you have with your health care provider.   Document Released: 09/03/2011 Document Revised: 06/05/2015 Document Reviewed: 12/10/2014 Elsevier Interactive Patient Education [employer].

## 2015-07-28 NOTE — ED Notes (Signed)
Pt. Was seen on 10/24 at [facility] and was diagnosed with a UTI and Dementia. Pt. Has not been taking meds for the past couple of days. Pt. Lives at home with 24 hr home health care. Pt. Has multiple bruises from multiple falls over the past couple of days.

## 2015-07-28 NOTE — ED Provider Notes (Signed)
CSN: [id number]     Arrival date & time 07/28/15  0010 History  By signing my name below, I, Paige Holland, attest that this documentation has been prepared under the direction and in the presence of Merryl Hacker, MD. Electronically Signed: Irene Holland, ED Scribe. 07/28/2015. 3:21 AM.    Chief Complaint  Patient presents with  . Dehydration   The history is provided by the EMS personnel. The history is limited by the absence of a caregiver. No language interpreter was used.   HPI Comments (Level 5 Caveat for dementia): Paige Holland is a 74 y.o. Female with a hx of cancer, HTN, neuropathy, and DM brought in by EMS who presents to the Emergency Department complaining of dehydration. Per triage note, pt was diagnosed with a UTI and dementia on 10/24 but has not been taking her anti-biotics. Per EMS, pt has been falling more frequently at home. Pt states that she does not know why she is in the hospital. She denies pain anywhere. She is able to orient to place and person but not time. Per nurse, pt is allergic to ACE inhibitors.    Past Medical History  Diagnosis Date  . Sleep apnea     does not use cpap but has one  . Cancer (HCC)     melanoma lft arm  . Anemia     hx  . Hypertension     takes Furosemide,Atenolol,and Losartan daily  . Hyperlipidemia     takes Atorvastatin daily  . Heart murmur   . Peripheral neuropathy (HCC)     takes Lyrica daily  . Pulmonary hypertension (HCC)     mod pulm HTN, PA pressure 50 mm Hg 12/11/10 ([facility])  . Peripheral neuropathy ([place])   . Peripheral edema   . Leaky heart valve     mild AS, mild MR, mod pulm HTN, EF 65% 12/11/10 ([facility])  . Restless leg   . Dizziness   . Chronic back pain   . History of kidney stones   . History of gout     takes Allopurinol daily  . GERD (gastroesophageal reflux disease)     takes Omeprazole daily  . Diverticulosis   . History of bladder infections   . PONV (postoperative nausea and  vomiting)     pt states went into urinary failure after last surgery  . History of blood transfusion   . Hypothyroidism     takes Synthroid daily  . Diabetes mellitus without complication ([place])     takes Lantus daily;takes Metformin and Glimepiride daily as instructed and Actos  . Cataracts, bilateral     immature   Past Surgical History  Procedure Laterality Date  . Cholecystectomy  81  . Appendectomy    . Dilation and curettage of uterus  90  . Carpal tunnel release  98    rt  . Trigeminal nerve decompression  2000    gamma knife  . Back surgery  2013  . Colonoscopy     History reviewed. No pertinent family history. Social History  Substance Use Topics  . Smoking status: Former Smoker -- 0.50 packs/day for 3 years    Types: Cigarettes    Quit date: 05/20/1966  . Smokeless tobacco: Never Used  . Alcohol Use: No   OB History    No data available     Review of Systems  Unable to perform ROS: Dementia  All other systems reviewed and are negative.  Allergies  Codeine; Talwin; and Tape  Home Medications   Prior to Admission medications   Medication Sig Start Date End Date Taking? Authorizing Provider  allopurinol (ZYLOPRIM) 300 MG tablet Take 300 mg by mouth every morning.   Yes Historical Provider, MD  aspirin EC 81 MG tablet Take 81 mg by mouth daily.   Yes Historical Provider, MD  Calcium Carbonate-Vit D-Min (CALCIUM 1200 PO) Take 1 tablet by mouth 2 (two) times daily.    Yes Historical Provider, MD  Cholecalciferol (VITAMIN D) 2000 UNITS tablet Take 2,000 Units by mouth every morning.   Yes Historical Provider, MD  clopidogrel (PLAVIX) 75 MG tablet Take 75 mg by mouth daily.   Yes Historical Provider, MD  furosemide (LASIX) 40 MG tablet Take 60 mg by mouth daily.    Yes Historical Provider, MD  hydrALAZINE (APRESOLINE) 25 MG tablet Take 25 mg by mouth 2 (two) times daily.   Yes Historical Provider, MD  insulin aspart (NOVOLOG) 100 UNIT/ML injection Inject 0-2  Units into the skin 3 (three) times daily as needed for high blood sugar. 0-149=0 units 150-200=2 units 201-250=4 units 251-300=6 units 301-350=8 units 351-400=10 units   Yes Historical Provider, MD  insulin glargine (LANTUS) 100 UNIT/ML injection Inject 25 Units into the skin every morning.    Yes Historical Provider, MD  labetalol (NORMODYNE) 300 MG tablet Take 300 mg by mouth 2 (two) times daily.   Yes Historical Provider, MD  levothyroxine (SYNTHROID, LEVOTHROID) 125 MCG tablet Take 125 mcg by mouth daily.   Yes Historical Provider, MD  losartan (COZAAR) 100 MG tablet Take 100 mg by mouth every morning.   Yes Historical Provider, MD  Multiple Vitamin (MULTIVITAMIN WITH MINERALS) TABS Take 1 tablet by mouth daily.   Yes Historical Provider, MD  nitroGLYCERIN (NITROSTAT) 0.4 MG SL tablet Place 0.4 mg under the tongue every 5 (five) minutes as needed for chest pain.   Yes Historical Provider, MD  Omega-3 Fatty Acids (FISH OIL) 1000 MG CAPS Take 1 capsule by mouth 3 (three) times daily.   Yes Historical Provider, MD  omeprazole (PRILOSEC) 20 MG capsule Take 20 mg by mouth daily.   Yes Historical Provider, MD  pioglitazone (ACTOS) 45 MG tablet Take 45 mg by mouth every morning.   Yes Historical Provider, MD  potassium chloride SA (K-DUR,KLOR-CON) 20 MEQ tablet Take 20 mEq by mouth every morning.   Yes Historical Provider, MD  traMADol (ULTRAM) 50 MG tablet Take 50 mg by mouth every 6 (six) hours as needed. For pain   Yes Historical Provider, MD  VENLAFAXINE HCL PO Take 1 tablet by mouth every morning.   Yes Historical Provider, MD  vitamin B-12 (CYANOCOBALAMIN) 1000 MCG tablet Take 1,000 mcg by mouth every morning.   Yes Historical Provider, MD  atenolol (TENORMIN) 50 MG tablet Take 50 mg by mouth every morning.    Historical Provider, MD  atorvastatin (LIPITOR) 40 MG tablet Take 40 mg by mouth at bedtime.    Historical Provider, MD  glimepiride (AMARYL) 4 MG tablet Take 4 mg by mouth 2 (two)  times daily.    Historical Provider, MD  HYDROmorphone (DILAUDID) 4 MG tablet Take 1 tablet (4 mg total) by mouth every 4 (four) hours as needed. 10/05/12   Kary Kos, MD  metFORMIN (GLUCOPHAGE) 1000 MG tablet Take 1,000 mg by mouth 2 (two) times daily with a meal.    Historical Provider, MD  oxyCODONE-acetaminophen (PERCOCET/ROXICET) 5-325 MG per tablet Take 1-2 tablets by mouth every 4 (four) hours as needed. For  pain    Historical Provider, MD  pregabalin (LYRICA) 50 MG capsule Take 50 mg by mouth at bedtime.    Historical Provider, MD  rOPINIRole (REQUIP) 1 MG tablet Take 1 mg by mouth at bedtime.    Historical Provider, MD   BP 157/63 mmHg  Pulse 66  Temp(Src) 97.7 F (36.5 C) (Oral)  Resp 16  Ht 5\' 4"  (1.626 m)  Wt 230 lb (104.327 kg)  BMI 39.46 kg/m2  SpO2 95% Physical Exam  Constitutional: No distress.  HENT:  Head: Normocephalic.  Extensive bruising which appears subacute over the right eye extending into the right cheek inferiorly, midface stable  Eyes: EOM are normal. Pupils are equal, round, and reactive to light.  Cardiovascular: Normal rate and regular rhythm.   Murmur heard. Ejection murmur  Pulmonary/Chest: Effort normal and breath sounds normal. No respiratory distress. She has no wheezes.  Abdominal: Soft. Bowel sounds are normal. There is no tenderness. There is no rebound.  Neurological: She is alert.  Oriented 2, disoriented to time, cranial nerves II through XII intact, equal strength in all 4 extremities  Skin: Skin is warm and dry.  Psychiatric: She has a normal mood and affect.  Nursing note and vitals reviewed.   ED Course  Procedures (including critical care time) DIAGNOSTIC STUDIES: Oxygen Saturation is 96% on RA, normal by my interpretation.    COORDINATION OF CARE: 12:39 AM-EKG  Labs Review Labs Reviewed  COMPREHENSIVE METABOLIC PANEL - Abnormal; Notable for the following:    Chloride 95 (*)    Glucose, Bld 316 (*)    Total Protein 6.3 (*)     Albumin 2.9 (*)    AST 68 (*)    Total Bilirubin 1.8 (*)    GFR calc non Af Amer 59 (*)    All other components within normal limits  URINALYSIS, ROUTINE W REFLEX MICROSCOPIC (NOT AT [facility]) - Abnormal; Notable for the following:    Color, Urine AMBER (*)    APPearance CLOUDY (*)    Glucose, UA >1000 (*)    Bilirubin Urine MODERATE (*)    Ketones, ur 40 (*)    Protein, ur >300 (*)    All other components within normal limits  URINE MICROSCOPIC-ADD ON - Abnormal; Notable for the following:    Squamous Epithelial / LPF FEW (*)    Bacteria, UA FEW (*)    Casts HYALINE CASTS (*)    All other components within normal limits  TROPONIN I - Abnormal; Notable for the following:    Troponin I 0.06 (*)    All other components within normal limits  URINE CULTURE  CBC WITH DIFFERENTIAL/PLATELET    Imaging Review Ct Head Wo Contrast  07/28/2015  CLINICAL DATA:  Recurrent falls.  Altered mental status EXAM: CT HEAD WITHOUT CONTRAST TECHNIQUE: Contiguous axial images were obtained from the base of the skull through the vertex without intravenous contrast. COMPARISON:  July 22, 2015 FINDINGS: Moderate diffuse atrophy is stable. There is no intracranial mass, hemorrhage, extra-axial fluid collection, or midline shift. 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 There are small lacunar in infarcts in each anterior thalamus. There is patchy small vessel disease in the centra semiovale bilaterally, stable. There is no acute infarct evident. No new gray-white compartment lesion. The bony calvarium appears intact. The mastoid air cells are clear. IMPRESSION: Atrophy with periventricular small vessel disease.  Prior infarct medial left occipital lobe, stable. Small lacunar type infarcts in each thalamus. No acute infarct evident. No intracranial mass, hemorrhage, or  extra-axial fluid collection. Electronically Signed   By: Lowella Grip III M.D.   On: 07/28/2015 01:56   Dg Chest Portable 1 View  07/28/2015  CLINICAL DATA:  Acute onset of generalized chest pain. Initial encounter. EXAM: PORTABLE CHEST 1 VIEW COMPARISON:  Chest radiograph from 07/19/2015 FINDINGS: The lungs are mildly hypoexpanded but appear grossly clear. There is no evidence of focal opacification, pleural effusion or pneumothorax. The cardiomediastinal silhouette is borderline normal in size. No acute osseous abnormalities are seen. Thoracolumbar spinal fusion hardware is partially imaged. IMPRESSION: Lungs mildly hypoexpanded but grossly clear. Electronically Signed   By: Garald Balding M.D.   On: 07/28/2015 05:44   I have personally reviewed and evaluated these images and lab results as part of my medical decision-making.   EKG Interpretation   Date/Time:  Sunday July 28 2015 00:40:42 EDT Ventricular Rate:  70 PR Interval:  160 QRS Duration: 89 QT Interval:  505 QTC Calculation: 545 R Axis:   -25 Text Interpretation:  Sinus rhythm Probable left atrial enlargement LVH  with secondary repolarization abnormality Prolonged QT interval No prior  for comparison Confirmed by HORTON  MD, COURTNEY ([id number]) on 07/28/2015  2:39:14 AM      MDM   Final diagnoses:  FTT (failure to thrive) in adult  Elevated troponin I level   Patient presents with failure to thrive, decreased by mouth intake, and multiple falls. Has had several evaluations outside facility over the last week. Initially caregiver was not present. Patient is unable to provide history and does not know why she was here. It does appear that she has multiple old bruises and is not fully oriented. General lab workup initiated. CT head negative for acute bleed.  Lab workup shows evidence of possible mild dehydration. Discuss with with a caregiver. Patient was given fluids. Per the caregiver, the she and the patient's son are  concerned as the patient has not been taking her oral medications and has had decreased by mouth intake. She also states that the patient had been complaining of chest pain earlier today., Screening EKG shows no evidence of acute ischemia. Chest x-ray and troponin were added to the patient's workup. I spoke with the patient's son, Abbe Amsterdam who is the patient's power of attorney. He states that he just wants his mother to be as independent as possible. She's had a due to general decline in health and is being referred to hospice. He wanted to make sure that she was not dehydrated and had no obvious infection.  Unfortunately, troponin returned at 0.06. Patient is currently without complaint. This is of unknown clinical significance and now given the knowledge that the patient is being referred to hospice, it is unclear whether the son would pursue further workup. I spoke again with the son. We discussed at length the implications of a positive troponin and further workup including possible cardiac catheterization. I offered admission for rule out and cardiology evaluation. The son is hesitant regarding admission as he does not feel his mother would be comfortable and would likely get delirious. He confirms that she is DO NOT RESUSCITATE and he does not feel that she would want further intervention even if her positive troponin for an indication of heart disease. He understands that if she is discharged, she could go home and have high morbidity from ACS or even die. He understands.  Given her goals of care and that she is being referred to hospice, patient will be discharged home at the request of her son. I feel this is consistent with the patient's stated goals of care.  After history, exam, and medical workup I feel the patient has been appropriately medically screened and is safe for discharge home. Pertinent diagnoses were discussed with the patient. Patient was given return precautions.  I personally performed  the services described in this documentation, which was scribed in my presence. The recorded information has been reviewed and is accurate.    Merryl Hacker, MD 07/28/15 [phone]

## 2015-07-29 LAB — URINE CULTURE: Culture: NO GROWTH

## 2016-02-27 DEATH — deceased

## 2017-09-25 IMAGING — CT CT HEAD W/O CM
1 series · 15 of 29 positions shown, 19 images · non-contrast
Comparison: July 22, 2015

CLINICAL DATA: Recurrent falls.  Altered mental status

EXAM:
CT HEAD WITHOUT CONTRAST
TECHNIQUE: Contiguous axial images were obtained from the base of the skull
through the vertex without intravenous contrast.

[Series 2: head 5.0 h30s · axial · 0.42mm/px · z∈[-122,+8]mm · 15 of 29 slices shown, 19 images]
[im 2/29  brain]
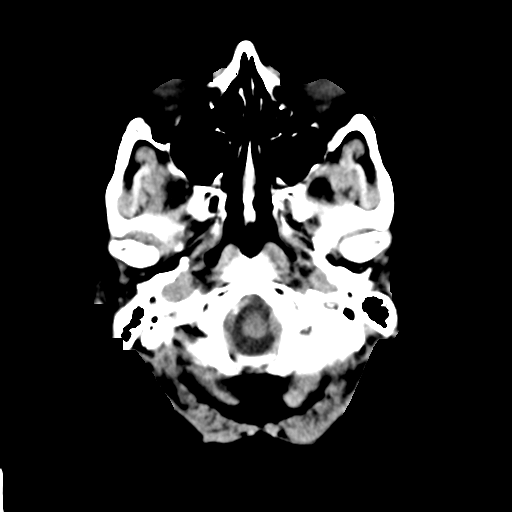
[im 2/29  bone]
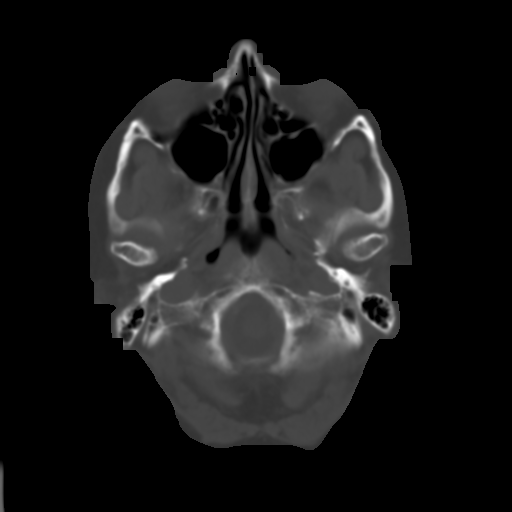
[im 4/29  brain]
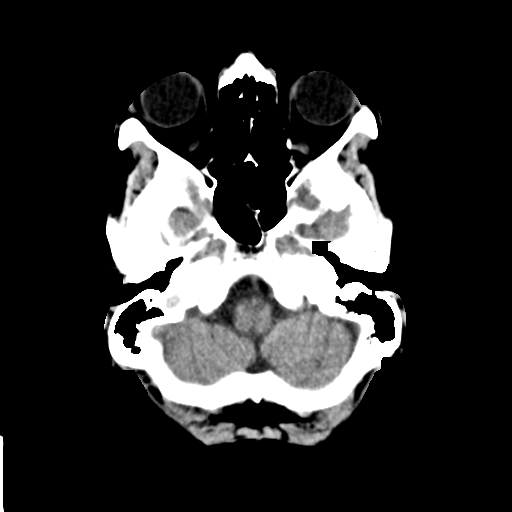
[im 6/29  brain]
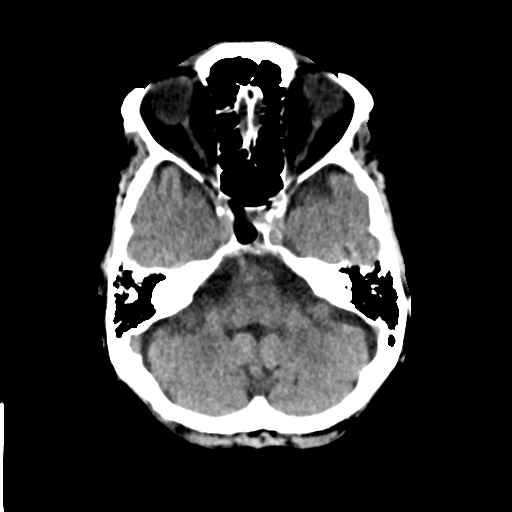
[im 8/29  brain]
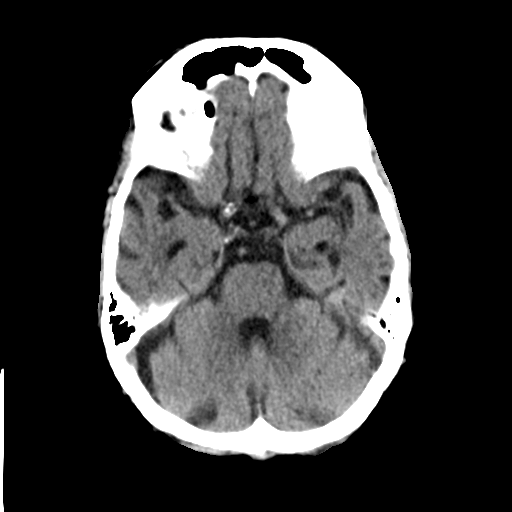
[im 10/29  brain]
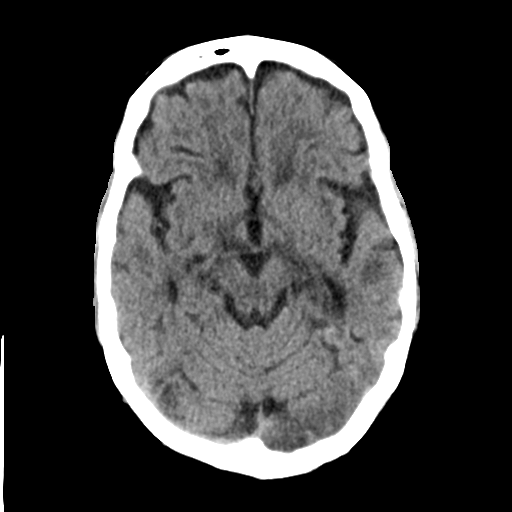
[im 10/29  bone]
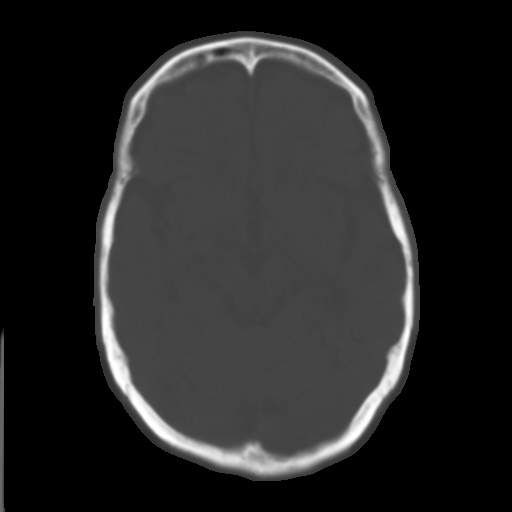
[im 11/29  brain]
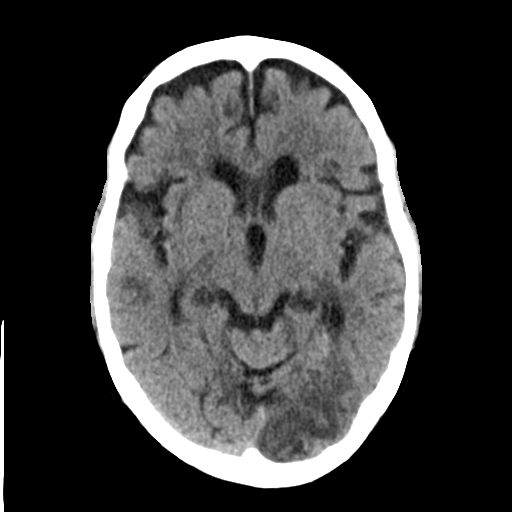
[im 13/29  brain]
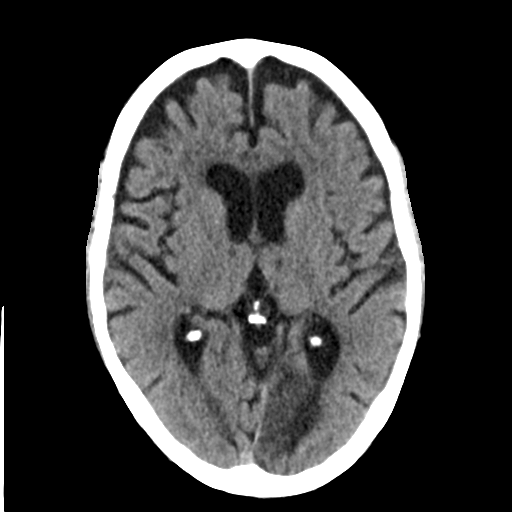
[im 15/29  brain]
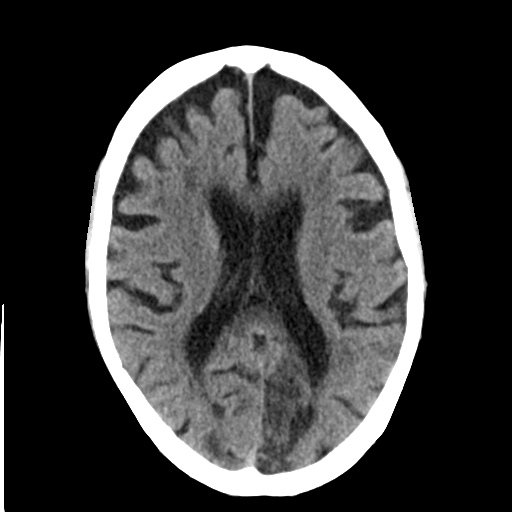
[im 17/29  brain]
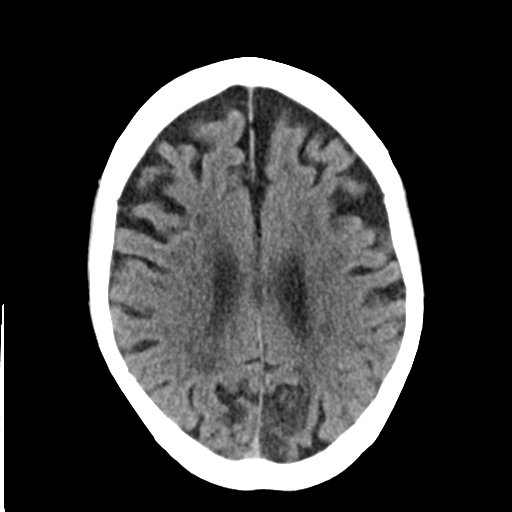
[im 17/29  bone]
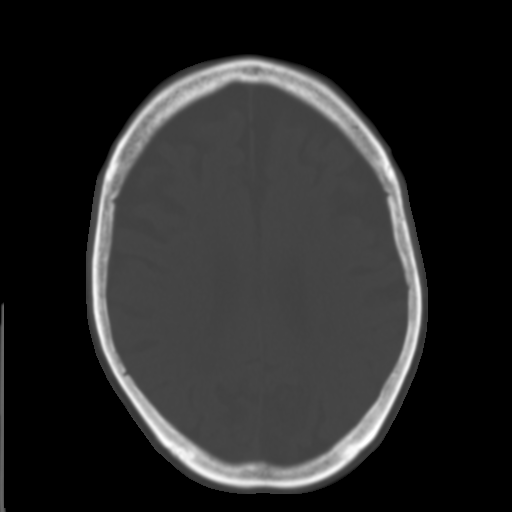
[im 19/29  brain]
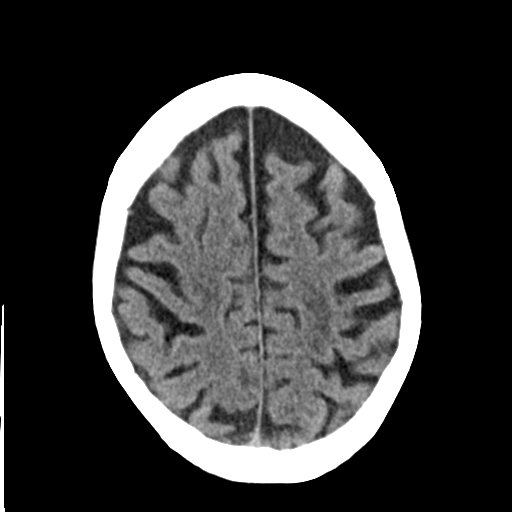
[im 20/29  brain]
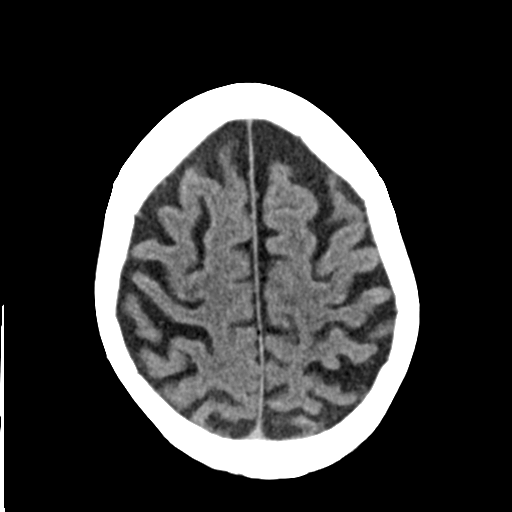
[im 22/29  brain]
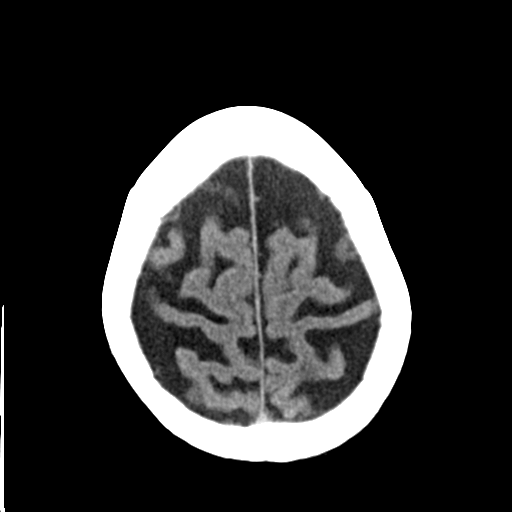
[im 24/29  brain]
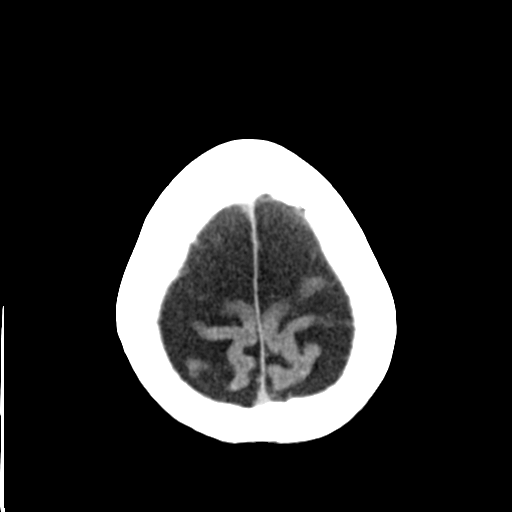
[im 24/29  bone]
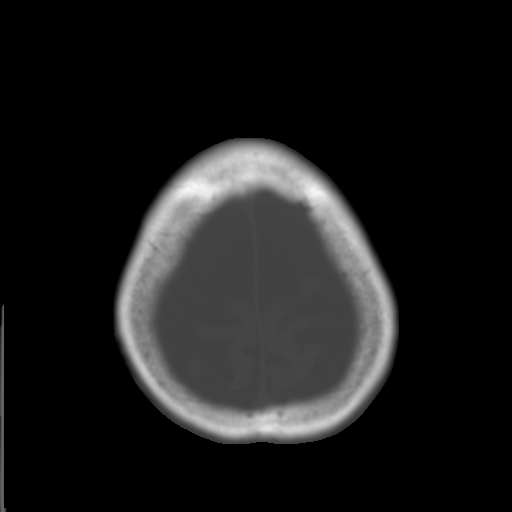
[im 26/29  brain]
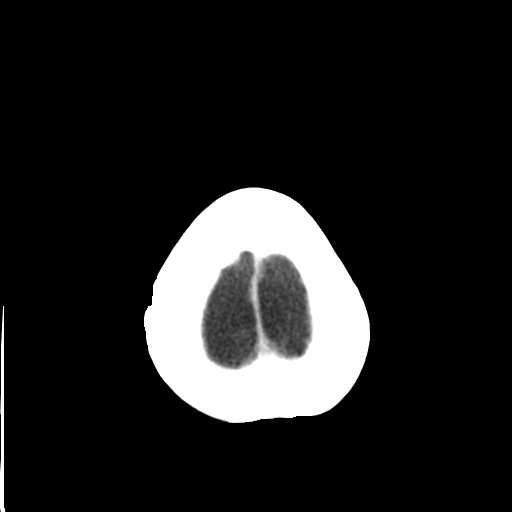
[im 28/29  brain]
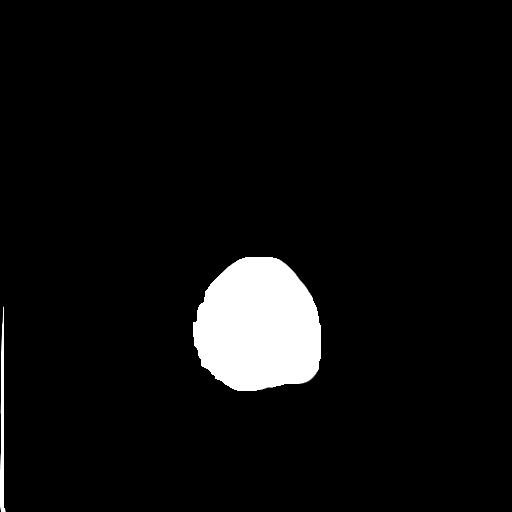

[15 of 29 positions shown; findings below may reference images not displayed]

FINDINGS: Moderate diffuse atrophy is stable. There is no intracranial mass,
hemorrhage, extra-axial fluid collection, or midline shift.
Decreased attenuation is noted in the medial left occipital lobe, a
finding consistent with chronic infarct involving the calcarine
branch of the left posterior cerebral artery, stable. There are
small lacunar in infarcts in each anterior thalamus. There is patchy
small vessel disease in the centra semiovale bilaterally, stable.
There is no acute infarct evident. No new gray-white compartment
lesion. The bony calvarium appears intact. The mastoid air cells are
clear.
IMPRESSION: Atrophy with periventricular small vessel disease. Prior infarct
medial left occipital lobe, stable. Small lacunar type infarcts in
each thalamus. No acute infarct evident. No intracranial mass,
hemorrhage, or extra-axial fluid collection.

## 2017-09-25 IMAGING — CR DG CHEST 1V PORT
1 series · 1 of 1 positions shown · non-contrast
Comparison: Chest radiograph from 07/19/2015

CLINICAL DATA: Acute onset of generalized chest pain. Initial
encounter.

EXAM:
PORTABLE CHEST 1 VIEW

[AP]
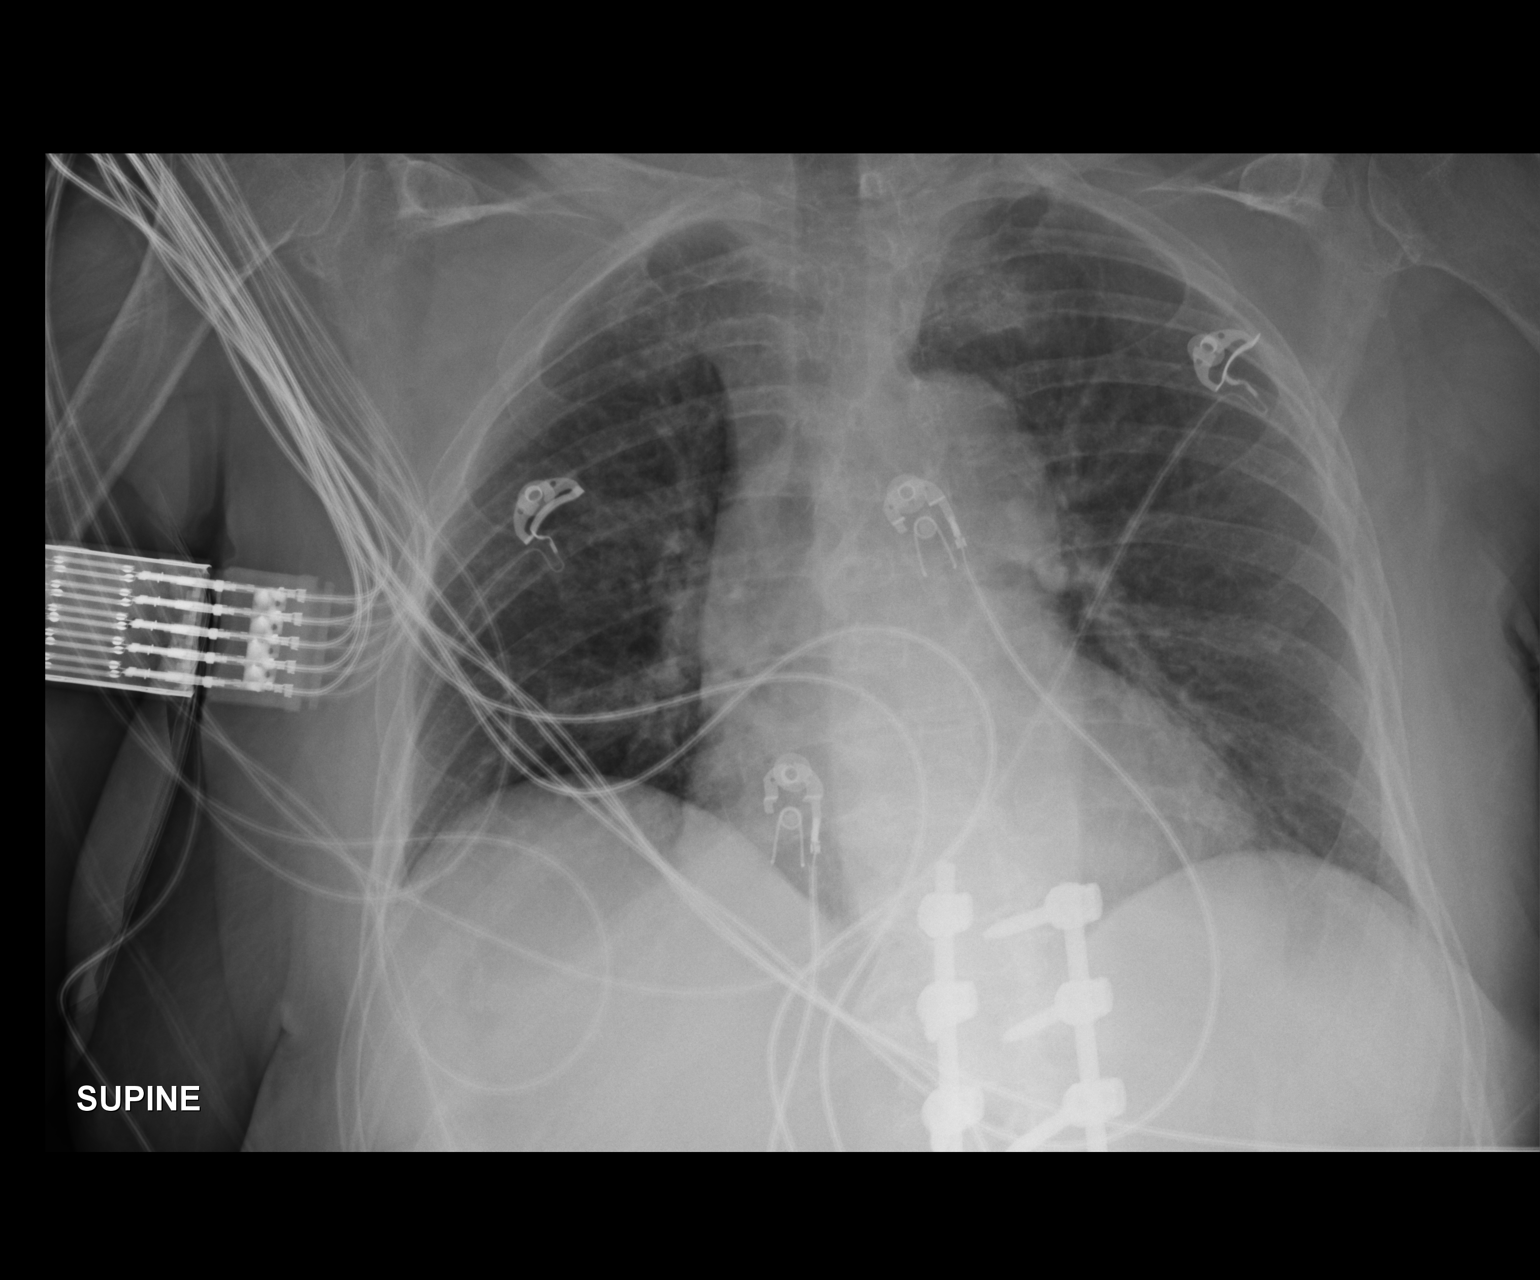

[1 of 1 positions shown; findings below may reference images not displayed]

FINDINGS: The lungs are mildly hypoexpanded but appear grossly clear. There is
no evidence of focal opacification, pleural effusion or
pneumothorax.

The cardiomediastinal silhouette is borderline normal in size. No
acute osseous abnormalities are seen. Thoracolumbar spinal fusion
hardware is partially imaged.
IMPRESSION: Lungs mildly hypoexpanded but grossly clear.
